# Patient Record
Sex: Female | Born: 1965 | Race: White | Hispanic: No | Smoking: Current every day smoker
Health system: Southern US, Community
[De-identification: ages and names within clinical notes are randomized; demographics above are authoritative.]

## PROBLEM LIST (undated history)

## (undated) DIAGNOSIS — I471 Supraventricular tachycardia, unspecified: Secondary | ICD-10-CM

## (undated) DIAGNOSIS — F1910 Other psychoactive substance abuse, uncomplicated: Secondary | ICD-10-CM

## (undated) DIAGNOSIS — F32A Depression, unspecified: Secondary | ICD-10-CM

## (undated) DIAGNOSIS — M8580 Other specified disorders of bone density and structure, unspecified site: Secondary | ICD-10-CM

## (undated) DIAGNOSIS — F329 Major depressive disorder, single episode, unspecified: Secondary | ICD-10-CM

## (undated) DIAGNOSIS — Z3046 Encounter for surveillance of implantable subdermal contraceptive: Secondary | ICD-10-CM

## (undated) HISTORY — DX: Encounter for surveillance of implantable subdermal contraceptive: Z30.46

## (undated) HISTORY — DX: Major depressive disorder, single episode, unspecified: F32.9

## (undated) HISTORY — DX: Other specified disorders of bone density and structure, unspecified site: M85.80

## (undated) HISTORY — DX: Other psychoactive substance abuse, uncomplicated: F19.10

## (undated) HISTORY — DX: Depression, unspecified: F32.A

---

## 1991-06-15 HISTORY — PX: EXTERNAL EAR SURGERY: SHX627

## 2007-06-15 HISTORY — PX: AUGMENTATION MAMMAPLASTY: SUR837

## 2007-11-13 HISTORY — PX: ENDOMETRIAL ABLATION: SHX621

## 2008-04-06 ENCOUNTER — Emergency Department (HOSPITAL_BASED_OUTPATIENT_CLINIC_OR_DEPARTMENT_OTHER): Admission: EM | Admit: 2008-04-06 | Discharge: 2008-04-06 | Payer: Self-pay | Admitting: Emergency Medicine

## 2008-09-04 ENCOUNTER — Ambulatory Visit: Payer: Self-pay | Admitting: Gynecology

## 2009-10-04 ENCOUNTER — Emergency Department (HOSPITAL_BASED_OUTPATIENT_CLINIC_OR_DEPARTMENT_OTHER): Admission: EM | Admit: 2009-10-04 | Discharge: 2009-10-04 | Payer: Self-pay | Admitting: Emergency Medicine

## 2009-12-25 ENCOUNTER — Ambulatory Visit: Payer: Self-pay | Admitting: Gynecology

## 2009-12-25 ENCOUNTER — Other Ambulatory Visit: Admission: RE | Admit: 2009-12-25 | Discharge: 2009-12-25 | Payer: Self-pay | Admitting: Gynecology

## 2010-01-13 ENCOUNTER — Ambulatory Visit: Payer: Self-pay | Admitting: Gynecology

## 2010-01-15 ENCOUNTER — Ambulatory Visit: Payer: Self-pay | Admitting: Gynecology

## 2010-03-17 ENCOUNTER — Ambulatory Visit: Payer: Self-pay | Admitting: Gynecology

## 2011-07-21 ENCOUNTER — Ambulatory Visit: Payer: BC Managed Care – PPO | Admitting: Family Medicine

## 2011-07-21 DIAGNOSIS — K921 Melena: Secondary | ICD-10-CM

## 2011-07-21 DIAGNOSIS — K5909 Other constipation: Secondary | ICD-10-CM

## 2011-07-21 DIAGNOSIS — K602 Anal fissure, unspecified: Secondary | ICD-10-CM

## 2011-07-21 NOTE — Progress Notes (Signed)
  Subjective:    Patient ID: Haley Carlson, female    DOB: 04/06/1966, 46 y.o.   MRN: [id number]  HPI 46 yo female with c/o rectal bleeding and constipation. Has been trying to gain weight, using protein shakes.  Has gotten very constipated, occasional liquid stool.  Constipated for several weeks - large stool that are hard to get out.  Bleeding started 3 days ago.  Only with stools, bright red.  Drops in toilet.  Painful.       Review of Systems Negative except as per HPI     Objective:   Physical Exam  Constitutional: She appears well-developed and well-nourished.  Pulmonary/Chest: Effort normal.  Genitourinary: Rectal exam shows fissure.          Assessment & Plan:  Constipation Blood in stool Anal fissure  cardizem gel, lidocaine gel Colace and miralax

## 2012-07-20 ENCOUNTER — Emergency Department (HOSPITAL_COMMUNITY)
Admission: EM | Admit: 2012-07-20 | Discharge: 2012-07-20 | Discharge status: Against Medical Advice | Payer: BC Managed Care – PPO | Attending: Emergency Medicine | Admitting: Emergency Medicine

## 2012-07-20 ENCOUNTER — Encounter (HOSPITAL_COMMUNITY): Payer: Self-pay | Admitting: *Deleted

## 2012-07-20 DIAGNOSIS — R079 Chest pain, unspecified: Secondary | ICD-10-CM | POA: Insufficient documentation

## 2012-07-20 DIAGNOSIS — F17200 Nicotine dependence, unspecified, uncomplicated: Secondary | ICD-10-CM | POA: Insufficient documentation

## 2012-07-20 LAB — BASIC METABOLIC PANEL
BUN: 20 mg/dL (ref 6–23)
CO2: 26 mEq/L (ref 19–32)
Calcium: 9.2 mg/dL (ref 8.4–10.5)
GFR calc non Af Amer: >90 mL/min (ref >90)
Glucose, Bld: 89 mg/dL (ref 70–99)
Sodium: 140 mEq/L (ref 135–145)

## 2012-07-20 LAB — CBC
HCT: 42.2 % (ref 36.0–46.0)
MCHC: 33.2 g/dL (ref 30.0–36.0)
MCV: 90.8 fL (ref 78.0–100.0)
Platelets: 225 10*3/uL (ref 150–400)
RBC: 4.65 MIL/uL (ref 3.87–5.11)

## 2012-07-20 LAB — POCT I-STAT TROPONIN I

## 2012-07-20 NOTE — ED Notes (Signed)
Pt reports "pain in her heart" 8/10 x2 weeks. Aching pain that makes her catch her breath. Slight SOB. Pt reports hx of this type of pain a few years ago. Once was dx with SVT, did not follow up with treatment. Reports nausea, denies vomiting.

## 2013-06-28 ENCOUNTER — Ambulatory Visit (INDEPENDENT_AMBULATORY_CARE_PROVIDER_SITE_OTHER): Payer: BC Managed Care – PPO | Admitting: Physician Assistant

## 2013-06-28 VITALS — BP 96/60 | HR 71 | Temp 97.9°F | Resp 16 | Ht 64.25 in | Wt 139.6 lb

## 2013-06-28 DIAGNOSIS — N951 Menopausal and female climacteric states: Secondary | ICD-10-CM

## 2013-06-28 DIAGNOSIS — F3289 Other specified depressive episodes: Secondary | ICD-10-CM

## 2013-06-28 DIAGNOSIS — F329 Major depressive disorder, single episode, unspecified: Secondary | ICD-10-CM

## 2013-06-28 DIAGNOSIS — F32A Depression, unspecified: Secondary | ICD-10-CM

## 2013-06-28 DIAGNOSIS — R232 Flushing: Secondary | ICD-10-CM

## 2013-06-28 MED ORDER — ESCITALOPRAM OXALATE 10 MG PO TABS: 10.0000 mg | ORAL_TABLET | Freq: Every day | ORAL | Status: DC

## 2013-06-28 NOTE — Patient Instructions (Addendum)
Continue attending AA meetings.  Select a therapist based on who is in your network.  Start the Lexapro by taking 1/2 tablet each day for the first week, then increase to the whole tablet.

## 2013-06-28 NOTE — Progress Notes (Signed)
   Subjective:    Patient ID: Haley Carlson, female    DOB: [birth year], 48 y.o.   MRN: [id number]  Reviewed medication, allergies, past medical history, family history and social history.   HPI  Feel sad all of the time. Never feels happy. Just doesn't feel right. Feeling began September or October of 2014.  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 Cocaine in 1980-1990 . Never used heroine or crack cocaine. Towards the end, only used alcohol and marijuana.  Has been sober for 4.5 years (May 2010). 12 steps are still working for her with regard to sobriety,  But not making her happy. Knows the steps to not drink and do drugs. Has a sponsor and a very supportive family.  Was on Lexapro 2-3 years ago but stopped taking it when she started feeling better. Has been on and off depression medications all of her life. Liked Lexapro the best and had the fewest side effects. Did not like Cymbalta made her curl up into a ball.  Doesn't "toss and turn" at night but gets up at least twice during the night.  Worries about everyday things (bills, work) and has "normal" stress.  No suicidal ideations.   Thinks she is in menopause. Has had hot flashes for at least 2 years. Getting very angry at the people she loved and very perturbed all of the time.  Is not interested in sex anymore. Isn't having any physical issues like vaginal dryness but is not interested.   Has chest pain occasional that she attributes to anxiety. Has lots of joint pain.  Is having trouble remembering little things (phone numbers she has known for years, things at work).   Review of Systems As above.     Objective:   Physical Exam  Constitutional: She is oriented to person, place, and time. She appears well-developed and well-nourished.  HENT:  Head: Normocephalic and atraumatic.  Cardiovascular: Normal rate, regular rhythm and normal heart sounds.   Pulmonary/Chest: Effort normal and breath sounds  normal.  Neurological: She is alert and oriented to person, place, and time.  Skin: Skin is warm and dry.  Psychiatric: Her speech is normal and behavior is normal. Judgment and thought content normal. Her mood appears not anxious. Cognition and memory are normal.  Appears sad. Made good eye contact.           Assessment & Plan:  1. Depression Restart Lexapro. Encourage to set up an appointment with therapist within her network and continue attending AA meetings regularly. Will re-evaluate in 6 weeks to determine if Lexapro dose is working for her or if it needs to be increased.  - escitalopram (LEXAPRO) 10 MG tablet; Take 1 tablet (10 mg total) by mouth daily.  Dispense: 90 tablet; Refill: 3  2. Hot flashes Lexapro should help to control hot flashes. If in 6 weeks she is still having hot flashes, will re-evaluate to determine alternative treatment option.

## 2013-06-29 ENCOUNTER — Encounter: Payer: Self-pay | Admitting: Physician Assistant

## 2013-06-29 DIAGNOSIS — F329 Major depressive disorder, single episode, unspecified: Secondary | ICD-10-CM | POA: Insufficient documentation

## 2013-06-29 DIAGNOSIS — F1911 Other psychoactive substance abuse, in remission: Secondary | ICD-10-CM | POA: Insufficient documentation

## 2013-06-29 DIAGNOSIS — F32A Depression, unspecified: Secondary | ICD-10-CM | POA: Insufficient documentation

## 2013-07-03 NOTE — Progress Notes (Signed)
I have examined this patient along with the student and agree.  

## 2013-07-03 NOTE — Progress Notes (Signed)
Patient scheduled appointment for 2/24 with Chelle for a CPE.

## 2013-08-07 ENCOUNTER — Encounter: Payer: BC Managed Care – PPO | Admitting: Physician Assistant

## 2013-08-09 ENCOUNTER — Ambulatory Visit: Payer: BC Managed Care – PPO | Admitting: Physician Assistant

## 2013-08-09 VITALS — BP 100/58 | HR 62 | Temp 97.8°F | Resp 16 | Ht 64.5 in | Wt 136.4 lb

## 2013-08-09 DIAGNOSIS — R059 Cough, unspecified: Secondary | ICD-10-CM

## 2013-08-09 DIAGNOSIS — R0981 Nasal congestion: Secondary | ICD-10-CM

## 2013-08-09 DIAGNOSIS — J329 Chronic sinusitis, unspecified: Secondary | ICD-10-CM

## 2013-08-09 DIAGNOSIS — R05 Cough: Secondary | ICD-10-CM

## 2013-08-09 DIAGNOSIS — J3489 Other specified disorders of nose and nasal sinuses: Secondary | ICD-10-CM

## 2013-08-09 MED ORDER — BENZONATATE 100 MG PO CAPS: 100.0000 mg | ORAL_CAPSULE | Freq: Three times a day (TID) | ORAL | Status: DC | PRN

## 2013-08-09 MED ORDER — AMOXICILLIN-POT CLAVULANATE 875-125 MG PO TABS: 1.0000 | ORAL_TABLET | Freq: Two times a day (BID) | ORAL | Status: DC

## 2013-08-09 MED ORDER — IPRATROPIUM BROMIDE 0.03 % NA SOLN: 2.0000 | Freq: Two times a day (BID) | NASAL | Status: DC

## 2013-08-09 NOTE — Progress Notes (Signed)
   Subjective:    Patient ID: Haley Carlson, female    DOB: [birth year], 48 y.o.   MRN: [id number]  HPI 48 year old female presents for evaluation of 1 week history of persistent nasal congestion, thick nasal discharge, sinus pain/pressure, slight headache, and bilateral ear fullness.  Has had slight cough that is worse at night. She has been taking Mucinex and has used a netty pot once but neither have helped much.  Admits to hx of sinus infections in the past - worse when she lived in [place].  Denies fever, chills, nausea, vomiting, dizziness, sore throat, SOB, wheezing, or chest pain     Review of Systems  Constitutional: Negative for fever and chills.  HENT: Positive for congestion, postnasal drip, rhinorrhea and sinus pressure. Negative for ear pain and sore throat.   Respiratory: Positive for cough. Negative for shortness of breath and wheezing.   Gastrointestinal: Negative for nausea, vomiting and abdominal pain.  Neurological: Positive for headaches. Negative for dizziness.       Objective:   Physical Exam  Constitutional: She is oriented to person, place, and time. She appears well-developed and well-nourished.  HENT:  Head: Normocephalic and atraumatic.  Right Ear: Hearing, tympanic membrane, external ear and ear canal normal.  Left Ear: Hearing, tympanic membrane, external ear and ear canal normal.  Nose: Right sinus exhibits maxillary sinus tenderness. Right sinus exhibits no frontal sinus tenderness. Left sinus exhibits maxillary sinus tenderness. Left sinus exhibits no frontal sinus tenderness.  Mouth/Throat: Uvula is midline, oropharynx is clear and moist and mucous membranes are normal.  Eyes: Conjunctivae are normal.  Neck: Normal range of motion. Neck supple.  Cardiovascular: Normal rate, regular rhythm and normal heart sounds.   Pulmonary/Chest: Effort normal and breath sounds normal.  Lymphadenopathy:    She has no cervical adenopathy.  Neurological: She is alert  and oriented to person, place, and time.  Psychiatric: She has a normal mood and affect. Her behavior is normal. Judgment and thought content normal.          Assessment & Plan:  Sinus infection - Plan: amoxicillin-clavulanate (AUGMENTIN) 875-125 MG per tablet  Nasal congestion - Plan: ipratropium (ATROVENT) 0.03 % nasal spray  Cough - Plan: benzonatate (TESSALON) 100 MG capsule  Will treat sinus infection with Augmentin 875 mg bid x 7 days Atrovent NS twice daily to help with congestion and PND Tessalon perles tid prn cough. Patient is in recovery and declined narcotic cough syrup Follow up if symptoms worsen or fail to improve.

## 2013-09-05 ENCOUNTER — Ambulatory Visit: Payer: BC Managed Care – PPO | Admitting: Physician Assistant

## 2013-09-05 VITALS — BP 104/64 | HR 63 | Temp 97.8°F | Resp 16 | Ht 63.0 in | Wt 137.0 lb

## 2013-09-05 DIAGNOSIS — J309 Allergic rhinitis, unspecified: Secondary | ICD-10-CM

## 2013-09-05 DIAGNOSIS — J329 Chronic sinusitis, unspecified: Secondary | ICD-10-CM

## 2013-09-05 DIAGNOSIS — R05 Cough: Secondary | ICD-10-CM

## 2013-09-05 DIAGNOSIS — R059 Cough, unspecified: Secondary | ICD-10-CM

## 2013-09-05 MED ORDER — FEXOFENADINE HCL 180 MG PO TABS: 180.0000 mg | ORAL_TABLET | Freq: Every day | ORAL | Status: DC

## 2013-09-05 MED ORDER — FLUTICASONE PROPIONATE 50 MCG/ACT NA SUSP: 2.0000 | Freq: Every day | NASAL | Status: DC

## 2013-09-05 MED ORDER — CEFDINIR 300 MG PO CAPS: 600.0000 mg | ORAL_CAPSULE | Freq: Every day | ORAL | Status: DC

## 2013-09-05 MED ORDER — ALBUTEROL SULFATE HFA 108 (90 BASE) MCG/ACT IN AERS: 2.0000 | INHALATION_SPRAY | RESPIRATORY_TRACT | Status: DC | PRN

## 2013-09-05 MED ORDER — BENZONATATE 100 MG PO CAPS: 100.0000 mg | ORAL_CAPSULE | Freq: Three times a day (TID) | ORAL | Status: DC | PRN

## 2013-09-05 NOTE — Progress Notes (Signed)
   Subjective:    Patient ID: Haley Carlson, female    DOB: 02-26-1966, 48 y.o.   MRN: [id number]  HPI   Haley Carlson is a pleasant 48 yr old female here because "I have a horrible sinus infection again."  Was here 3 weeks ago - treated for sinusitis with augmenting.  Did note some improvement but began to feel sick again 1 wk ago.  She did finish the full course of augementin as directed.  She is currently experiencing lots of nasal drainage.  She also has a productive cough.  She has some SOB and wheezing but she is a current every day smoker.  She is trying to switch to e-cigs.  Has been using Delsym for cough.  She does have some sinus pressure but no pain.  Using Delsym for cough.  Taking Allegra.   Review of Systems  Constitutional: Negative for fever and chills.  HENT: Positive for congestion, rhinorrhea and sinus pressure. Negative for ear pain.   Respiratory: Positive for cough, shortness of breath and wheezing.   Cardiovascular: Negative for chest pain.  Gastrointestinal: Negative.   Musculoskeletal: Negative.   Skin: Negative.        Objective:   Physical Exam  Vitals reviewed. Constitutional: She is oriented to person, place, and time. She appears well-developed and well-nourished. No distress.  HENT:  Head: Normocephalic and atraumatic.  Right Ear: Tympanic membrane and ear canal normal.  Left Ear: Tympanic membrane and ear canal normal.  Nose: Mucosal edema present. Right sinus exhibits maxillary sinus tenderness (minimal). Right sinus exhibits no frontal sinus tenderness. Left sinus exhibits maxillary sinus tenderness (minimal). Left sinus exhibits no frontal sinus tenderness.  Mouth/Throat: Uvula is midline, oropharynx is clear and moist and mucous membranes are normal.  Eyes: Conjunctivae are normal. No scleral icterus.  Neck: Neck supple.  Cardiovascular: Normal rate, regular rhythm and normal heart sounds.   Pulmonary/Chest: Effort normal and breath sounds normal.  She has no wheezes. She has no rales.  Lymphadenopathy:    She has no cervical adenopathy.  Neurological: She is alert and oriented to person, place, and time.  Skin: Skin is warm and dry.  Psychiatric: She has a normal mood and affect. Her behavior is normal.        Assessment & Plan:  Cough - Plan: benzonatate (TESSALON) 100 MG capsule, albuterol (PROVENTIL HFA;VENTOLIN HFA) 108 (90 BASE) MCG/ACT inhaler  Sinusitis - Plan: cefdinir (OMNICEF) 300 MG capsule, fluticasone (FLONASE) 50 MCG/ACT nasal spray, fexofenadine (ALLEGRA) 180 MG tablet  Allergic rhinitis - Plan: fluticasone (FLONASE) 50 MCG/ACT nasal spray, fexofenadine (ALLEGRA) 180 MG tablet, albuterol (PROVENTIL HFA;VENTOLIN HFA) 108 (90 BASE) MCG/ACT inhaler   Haley Carlson is a pleasant 48 yr old female here with ongoing sinusitis, incompletely resolution with augmentin 3 wks ago.  Today she has minimal sinus tenderness but copious nasal drainage as well as cough.  Will treat with omnicef.  I do think allergies are likely contributing to her symptoms.  I encouraged her to continue taking Allegra daily.  Add Flonase.  Tessalon for cough.  Will try albuterol inhaler as well to help with coughing spasms.  Encouraged her to quit smoking.  Push fluids, rest.    Pt to call or RTC if worsening or not improving  E. Frances FurbishElizabeth Gianah Batt MHS, PA-C [facility] 3/25/20156:35 PM

## 2013-09-05 NOTE — Patient Instructions (Signed)
The antibiotic prescribed today is for your present infection only. It is very important to follow the directions for the medication prescribed. Antibiotics are generally given for a specified period of time (7-10 days, for example) to be taken at specific intervals (every 4, 6, 8 or 12 hours). This is necessary to keep the right amount of the medication in the bloodstream. Too much of the medication may cause an adverse reaction, too little may not be completely effective.  To clear your infection completely, continue taking the antibiotic for the full time of treatment, even if you begin to feel better after a few days.  If you miss a dose of the antibiotic, take it as soon as possible. Then go back to your regular dosing schedule. However, don't double up doses.    Take the omnicef as directed.  Be sure to finish the full course  Take the fexofenadine (Allegra) once daily to help treat allergies  Use the fluticasone (Flonase) nasal spray once daily to help control allergy symptoms.  This works best when taken consistently every day to help prevent allergies  I think controlling your allergies better will help prevent sinus infections in the future  Use the Tessalon Perles if needed for cough  Use the albuterol inhaler if needed for coughing spasms  Plenty of fluids (water is best!) and rest  Stop smoking :)  If any symptoms are worsening or not improving, please let us know

## 2013-09-20 ENCOUNTER — Ambulatory Visit (INDEPENDENT_AMBULATORY_CARE_PROVIDER_SITE_OTHER): Payer: BC Managed Care – PPO | Admitting: Physician Assistant

## 2013-09-20 ENCOUNTER — Encounter: Payer: Self-pay | Admitting: Physician Assistant

## 2013-09-20 VITALS — BP 90/60 | HR 67 | Temp 98.0°F | Resp 16 | Ht 64.0 in | Wt 137.8 lb

## 2013-09-20 DIAGNOSIS — Z1211 Encounter for screening for malignant neoplasm of colon: Secondary | ICD-10-CM

## 2013-09-20 DIAGNOSIS — J3489 Other specified disorders of nose and nasal sinuses: Secondary | ICD-10-CM

## 2013-09-20 DIAGNOSIS — Z0000 Encounter for general adult medical examination without abnormal findings: Secondary | ICD-10-CM

## 2013-09-20 DIAGNOSIS — M2550 Pain in unspecified joint: Secondary | ICD-10-CM

## 2013-09-20 DIAGNOSIS — R6889 Other general symptoms and signs: Secondary | ICD-10-CM

## 2013-09-20 DIAGNOSIS — R0981 Nasal congestion: Secondary | ICD-10-CM

## 2013-09-20 DIAGNOSIS — R002 Palpitations: Secondary | ICD-10-CM

## 2013-09-20 DIAGNOSIS — R233 Spontaneous ecchymoses: Secondary | ICD-10-CM

## 2013-09-20 DIAGNOSIS — R059 Cough, unspecified: Secondary | ICD-10-CM

## 2013-09-20 DIAGNOSIS — R05 Cough: Secondary | ICD-10-CM

## 2013-09-20 DIAGNOSIS — R5381 Other malaise: Secondary | ICD-10-CM

## 2013-09-20 DIAGNOSIS — R238 Other skin changes: Secondary | ICD-10-CM

## 2013-09-20 DIAGNOSIS — Z1239 Encounter for other screening for malignant neoplasm of breast: Secondary | ICD-10-CM

## 2013-09-20 DIAGNOSIS — R5383 Other fatigue: Secondary | ICD-10-CM

## 2013-09-20 DIAGNOSIS — Z124 Encounter for screening for malignant neoplasm of cervix: Secondary | ICD-10-CM

## 2013-09-20 LAB — COMPREHENSIVE METABOLIC PANEL
ALK PHOS: 68 U/L (ref 39–117)
ALT: 26 U/L (ref 0–35)
AST: 26 U/L (ref 0–37)
Albumin: 4.3 g/dL (ref 3.5–5.2)
BILIRUBIN TOTAL: 0.4 mg/dL (ref 0.2–1.2)
BUN: 27 mg/dL — AB (ref 6–23)
CALCIUM: 9.3 mg/dL (ref 8.4–10.5)
CHLORIDE: 102 meq/L (ref 96–112)
CO2: 28 meq/L (ref 19–32)
Creat: 0.61 mg/dL (ref 0.50–1.10)
GLUCOSE: 76 mg/dL (ref 70–99)
Potassium: 4.5 mEq/L (ref 3.5–5.3)
SODIUM: 141 meq/L (ref 135–145)
TOTAL PROTEIN: 6.4 g/dL (ref 6.0–8.3)

## 2013-09-20 LAB — CBC WITH DIFFERENTIAL/PLATELET
BASOS ABS: 0.1 10*3/uL (ref 0.0–0.1)
BASOS PCT: 1 % (ref 0–1)
Eosinophils Absolute: 0.2 10*3/uL (ref 0.0–0.7)
Eosinophils Relative: 3 % (ref 0–5)
HCT: 38.3 % (ref 36.0–46.0)
HEMOGLOBIN: 12.9 g/dL (ref 12.0–15.0)
Lymphocytes Relative: 24 % (ref 12–46)
Lymphs Abs: 1.5 10*3/uL (ref 0.7–4.0)
MCH: 29.7 pg (ref 26.0–34.0)
MCHC: 33.7 g/dL (ref 30.0–36.0)
MCV: 88 fL (ref 78.0–100.0)
MONOS PCT: 9 % (ref 3–12)
Monocytes Absolute: 0.5 10*3/uL (ref 0.1–1.0)
NEUTROS ABS: 3.8 10*3/uL (ref 1.7–7.7)
NEUTROS PCT: 63 % (ref 43–77)
PLATELETS: 320 10*3/uL (ref 150–400)
RBC: 4.35 MIL/uL (ref 3.87–5.11)
RDW: 15 % (ref 11.5–15.5)
WBC: 6.1 10*3/uL (ref 4.0–10.5)

## 2013-09-20 LAB — POCT URINALYSIS DIPSTICK
BILIRUBIN UA: NEGATIVE
GLUCOSE UA: NEGATIVE
KETONES UA: NEGATIVE
Nitrite, UA: NEGATIVE
PROTEIN UA: NEGATIVE
RBC UA: NEGATIVE
Spec Grav, UA: >=1.03
Urobilinogen, UA: 0.2
pH, UA: 6

## 2013-09-20 LAB — LIPID PANEL
CHOLESTEROL: 222 mg/dL — AB (ref 0–200)
HDL: 82 mg/dL (ref >39)
LDL Cholesterol: 129 mg/dL — ABNORMAL HIGH (ref 0–99)
TRIGLYCERIDES: 56 mg/dL (ref <150)
Total CHOL/HDL Ratio: 2.7 Ratio
VLDL: 11 mg/dL (ref 0–40)

## 2013-09-20 LAB — POCT UA - MICROSCOPIC ONLY
CASTS, UR, LPF, POC: NEGATIVE
Crystals, Ur, HPF, POC: NEGATIVE
Mucus, UA: NEGATIVE
Yeast, UA: NEGATIVE

## 2013-09-20 LAB — IFOBT (OCCULT BLOOD): IMMUNOLOGICAL FECAL OCCULT BLOOD TEST: NEGATIVE

## 2013-09-20 MED ORDER — PREDNISONE 20 MG PO TABS: ORAL_TABLET | ORAL | Status: DC

## 2013-09-20 NOTE — Patient Instructions (Signed)
I will contact you with your lab results as soon as they are available.   If you have not heard from me in 2 weeks, please contact me.  The fastest way to get your results is to register for My Chart (see the instructions on the last page of this printout).  Keeping You Healthy  Get These Tests 1. Blood Pressure- Have your blood pressure checked once a year by your health care provider.  Normal blood pressure is 120/80. 2. Weight- Have your body mass index (BMI) calculated to screen for obesity.  BMI is measure of body fat based on height and weight.  You can also calculate your own BMI at www.nhlbisupport.com/bmi/. 3. Cholesterol- Have your cholesterol checked every 5 years starting at age 20 then yearly starting at age 45. 4. Chlamydia, HIV, and other sexually transmitted diseases- Get screened every year until age 25, then within three months of each new sexual provider. 5. Pap Smear- Every 1-3 years; discuss with your health care provider. 6. Mammogram- Every year starting at age 40  Take these medicines  Calcium with Vitamin D-Your body needs 1200 mg of Calcium each day and 800-1000 IU of Vitamin D daily.  Your body can only absorb 500 mg of Calcium at a time so Calcium must be taken in 2 or 3 divided doses throughout the day.  Multivitamin with folic acid- Once daily if it is possible for you to become pregnant.  Get these Immunizations  Gardasil-Series of three doses; prevents HPV related illness such as genital warts and cervical cancer.  Menactra-Single dose; prevents meningitis.  Tetanus shot- Every 10 years.  Flu shot-Every year.  Take these steps 1. Do not smoke-Your healthcare provider can help you quit.  For tips on how to quit go to www.smokefree.gov or call 1-800 QUITNOW. 2. Be physically active- Exercise 5 days a week for at least 30 minutes.  If you are not already physically active, start slow and gradually work up to 30 minutes of moderate physical activity.   Examples of moderate activity include walking briskly, dancing, swimming, bicycling, etc. 3. Breast Cancer- A self breast exam every month is important for early detection of breast cancer.  For more information and instruction on self breast exams, ask your healthcare provider or www.womenshealth.gov/faq/breast-self-exam.cfm. 4. Eat a healthy diet- Eat a variety of healthy foods such as fruits, vegetables, whole grains, low fat milk, low fat cheeses, yogurt, lean meats, poultry and fish, beans, nuts, tofu, etc.  For more information go to www. Thenutritionsource.org 5. Drink alcohol in moderation- Limit alcohol intake to one drink or less per day. Never drink and drive. 6. Depression- Your emotional health is as important as your physical health.  If you're feeling down or losing interest in things you normally enjoy please talk to your healthcare provider about being screened for depression. 7. Dental visit- Brush and floss your teeth twice daily; visit your dentist twice a year. 8. Eye doctor- Get an eye exam at least every 2 years. 9. Helmet use- Always wear a helmet when riding a bicycle, motorcycle, rollerblading or skateboarding. 10. Safe sex- If you may be exposed to sexually transmitted infections, use a condom. 11. Seat belts- Seat belts can save your live; always wear one. 12. Smoke/Carbon Monoxide detectors- These detectors need to be installed on the appropriate level of your home. Replace batteries at least once a year. 13. Skin cancer- When out in the sun please cover up and use sunscreen 15 SPF or higher.   14. Violence- If anyone is threatening or hurting you, please tell your healthcare provider.        

## 2013-09-20 NOTE — Progress Notes (Addendum)
Subjective:    Patient ID: Haley Carlson, female    DOB: 12/14/1965, 48 y.o.   MRN: [id number]  HPI  47y.o female with history of depression presents for annual physical exam.  She also feels as though she still has a sinus infection.  She was treated for sinusitis in February with Augmentin and again two weeks ago with Cefdenir.  She feels like her sinus pressure, sore throat and cough has improved but she is still experiencing productive cough of greenish sputum, sinus pressure and a generalized ill feeling.  Pt is also concerned about her low BP readings and feeling of dizziness the past few months.  She describes having palpitations but denies syncope, HA, chest pain, SOB, peripheral edema, or any cardiac history.  Pt also has a lesion on L anterior lower leg at mid calf level that she thinks is a wart.  Another lesion on L lateral thigh that has been there for several years, has not grown in size, but has never healed due to re-opening the lesion with shaving.   Review of Systems  Constitutional: Positive for diaphoresis and fatigue.  HENT: Positive for congestion, sinus pressure and sore throat.   Eyes: Negative.   Respiratory: Positive for cough. Negative for chest tightness, shortness of breath and wheezing.   Cardiovascular: Positive for palpitations. Negative for chest pain and leg swelling.  Gastrointestinal: Negative.   Endocrine: Positive for cold intolerance.  Genitourinary: Negative.   Musculoskeletal: Negative.   Skin: Negative for rash.  Allergic/Immunologic: Positive for environmental allergies.  Neurological: Positive for dizziness. Negative for syncope, weakness, light-headedness, numbness and headaches.  Hematological: Bruises/bleeds easily.  Psychiatric/Behavioral: Negative.        Objective:   Physical Exam  Constitutional: She is oriented to person, place, and time. She appears well-developed and well-nourished. No distress.  HENT:  Head: Normocephalic.    Eyes: Pupils are equal, round, and reactive to light.  Neck: Normal range of motion. Neck supple. No thyromegaly present.  Cardiovascular: Normal rate, regular rhythm, normal heart sounds and intact distal pulses.  Exam reveals no gallop and no friction rub.   No murmur heard. Pulmonary/Chest: Effort normal and breath sounds normal. No respiratory distress. She has no wheezes. She has no rales.  Abdominal: Soft. Bowel sounds are normal. She exhibits no distension and no mass. There is no tenderness.  Genitourinary: Vagina normal. There is no rash, tenderness or lesion on the right labia. There is no rash, tenderness or lesion on the left labia. Cervix exhibits no motion tenderness and no discharge. Right adnexum displays no mass and no tenderness. Left adnexum displays no mass and no tenderness. No tenderness or bleeding around the vagina. No vaginal discharge found.  Musculoskeletal: Normal range of motion. She exhibits no edema.  Symmetrical strength in UE, LE  Lymphadenopathy:    She has no cervical adenopathy.  Neurological: She is alert and oriented to person, place, and time. She displays normal reflexes. No cranial nerve deficit. Coordination normal.  Psychiatric: She has a normal mood and affect. Her behavior is normal.  Breast: No skin changes; No lumps, masses, or tender areas. No nipple inversion or discharge. No palpable lymph nodes. Skin: L anterior lower leg with hyperpigmented, hyperkeratotic, waxy, stuck on appearance 5mm diameter.  L posterior-lateral thigh with raised pink hyperkeratotic, scaled lesion approximately 5mm in diameter        Assessment & Plan:   1. Annual physical exam No remarkable findings on exam.  Pt with prior low  BP readings within 10 mmHg systolic.  Will await labs. - Lipid panel - POCT UA - Microscopic Only - POCT urinalysis dipstick  2. Cough 3. Nasal congestion Pt diagnosed with sinusitis and given augmentin at 2/26 office visit.  Today's  symptoms thought to be a result of remaining inflammation. - predniSONE (DELTASONE) 20 MG tablet; Take 3 PO QAM x3days, 2 PO QAM x3days, 1 PO QAM x3days  Dispense: 18 tablet; Refill: 0  4. Cold intolerance 5. Fatigue 6. Easy bruising 7. Palpitations 8. Joint pain Await lab results - CBC with Differential - Comprehensive metabolic panel - TSH   9. Screening for cervical cancer Only had cytology at 2011 office visit.  Repeat cytology and HPV as part of cervical cancer screening. - Pap IG and HPV (high risk) DNA detection  10. Screening for breast cancer Normal mammography in 2009.  Breast exam normal in office today.  Pt will schedule mammogram. - MM Digital Screening; Future  11. Screening for colon cancer Awaiting results - IFOBT POC (occult bld, rslt in office)

## 2013-09-20 NOTE — Progress Notes (Signed)
I have examined this patient along with the student and agree.  She will schedule another appointment for removal of the skin lesions on her leg.

## 2013-09-21 LAB — TSH: TSH: 1.777 u[IU]/mL (ref 0.350–4.500)

## 2013-09-24 LAB — PAP IG AND HPV HIGH-RISK: HPV DNA HIGH RISK: DETECTED — AB

## 2013-09-25 ENCOUNTER — Telehealth: Payer: Self-pay | Admitting: Family Medicine

## 2013-09-25 NOTE — Telephone Encounter (Signed)
Patient called requesting lab results

## 2013-09-26 NOTE — Telephone Encounter (Signed)
Patient notified via My Chart

## 2013-09-27 ENCOUNTER — Telehealth: Payer: Self-pay

## 2013-09-27 NOTE — Telephone Encounter (Signed)
Patient called for labs, reviewed Chelle's message with here.    Hannia,  I apologize for the delay in getting your results to you. I was waiting for your pap results, and they took longer. Thanks for reaching out.  As you can see, the blood count and kidney and liver tests are normal (the BUN is only mildly elevated, and is fine at its current level). The total cholesterol and LDL ('bad type') cholesterol are mildly elevated. The thyroid test is normal. 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  For the cholesterol, I recommend healthy eating, regular exercise, and OTC Fish Oil, 2-4 grams/day. We should recheck in 6-12 months. If it's not improving, we'll plan to discuss prescription medication to help.  Because the HPV test was positive, but the cells were all normal, we need to repeat BOTH tests in 12 months.  Please let me know if you have any questions or concerns.  Warmly,

## 2013-09-28 ENCOUNTER — Ambulatory Visit: Payer: BC Managed Care – PPO | Admitting: Internal Medicine

## 2013-09-28 VITALS — BP 124/80 | HR 52 | Temp 98.1°F | Resp 16 | Ht 64.0 in | Wt 141.0 lb

## 2013-09-28 DIAGNOSIS — S29011A Strain of muscle and tendon of front wall of thorax, initial encounter: Secondary | ICD-10-CM

## 2013-09-28 DIAGNOSIS — IMO0002 Reserved for concepts with insufficient information to code with codable children: Secondary | ICD-10-CM

## 2013-09-28 MED ORDER — CYCLOBENZAPRINE HCL 10 MG PO TABS: 10.0000 mg | ORAL_TABLET | Freq: Every day | ORAL | Status: DC

## 2013-09-28 MED ORDER — MELOXICAM 15 MG PO TABS: 15.0000 mg | ORAL_TABLET | Freq: Every day | ORAL | Status: DC

## 2013-09-28 NOTE — Patient Instructions (Signed)
Put on light duty for 2 weeks at work not to lift more than 5 lbs. Take meloxicam 15 mg 1 daily for  Pain and anti inflammatory properties Take cyclobenzaprine 10 mg 1 at bedtime to help with relief during sleep

## 2013-09-28 NOTE — Progress Notes (Signed)
Subjective:     Patient ID: Haley Carlson, female   DOB: 01-15-1966, 48 y.o.   MRN: [id number]  HPI 48 YO caucasian female [facility] right sided anterior rib/chest wall pain worse with abduction of the arm, lifting of heavy objects, coughing, deep breathing, and getting out of bed. 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 She started having this pain on Tuesday after work but has no known injury to the affected area. She has never had pain here before and is not currently taking anything for the pain. The pain is intermittent and worse with activity. She has no other symptoms but is having trouble with her physical activity at work.   Prior to Admission medications   Medication Sig Start Date End Date Taking? Authorizing Provider  albuterol (PROVENTIL HFA;VENTOLIN HFA) 108 (90 BASE) MCG/ACT inhaler Inhale 2 puffs into the lungs every 4 (four) hours as needed for wheezing or shortness of breath (cough, shortness of breath or wheezing.). 09/05/13  Yes Eleanore Delia ChimesE Egan, PA-C  aspirin 325 MG tablet Take 325 mg by mouth every 6 (six) hours as needed. Pain   Yes Historical Provider, MD  benzonatate (TESSALON) 100 MG capsule Take 1-2 capsules (100-200 mg total) by mouth 3 (three) times daily as needed for cough. 09/05/13  Yes Eleanore E Debbra RidingEgan, PA-C  escitalopram (LEXAPRO) 10 MG tablet Take 1 tablet (10 mg total) by mouth daily. 06/28/13  Yes Chelle S Jeffery, PA-C  fexofenadine (ALLEGRA) 180 MG tablet Take 1 tablet (180 mg total) by mouth daily. 09/05/13  Yes Eleanore E Egan, PA-C  fluticasone (FLONASE) 50 MCG/ACT nasal spray Place 2 sprays into both nostrils daily. 09/05/13  Yes Eleanore E Debbra RidingEgan, PA-C  ipratropium (ATROVENT) 0.03 % nasal spray Place 2 sprays into the nose 2 (two) times daily. 08/09/13  Yes Heather M Marte, PA-C  predniSONE (DELTASONE)  20 MG tablet Take 3 PO QAM x3days, 2 PO QAM x3days, 1 PO QAM x3days 09/20/13   Fernande Brashelle S Jeffery, PA-C    Review of Systems  Constitutional: Positive for activity change. Negative for fever, chills, diaphoresis, appetite change and fatigue.  HENT: Positive for rhinorrhea and sinus pressure. Negative for ear discharge, ear pain and postnasal drip.        Congestion secondary to her usual seasonal allergies  Eyes: Negative for pain and redness.  Respiratory: Positive for cough and chest tightness. Negative for wheezing.   Cardiovascular: Negative for chest pain, palpitations and leg swelling.  Gastrointestinal: Negative for nausea, vomiting, abdominal pain, diarrhea, constipation, blood in stool and abdominal distention.  Musculoskeletal: Negative for arthralgias, myalgias and neck pain.       Pain in lower anterior chest wall/ribs with movement breathing cough and getting into and out of bed  Allergic/Immunologic: Positive for environmental allergies.  Neurological: Negative for dizziness, weakness, light-headedness and headaches.       Objective:   Physical Exam  Constitutional: She is oriented to person, place, and time. She appears well-developed and well-nourished. No distress.  HENT:  Head: Normocephalic and atraumatic.  Right Ear: External ear normal.  Left Ear: External ear normal.  Mouth/Throat: Oropharynx is clear and moist. No oropharyngeal exudate.  Eyes: Conjunctivae are normal. Pupils are equal, round, and reactive to light.  Cardiovascular: Normal rate, regular rhythm, normal heart sounds and intact distal pulses.  Exam  reveals no gallop and no friction rub.   No murmur heard. Pulmonary/Chest: Effort normal and breath sounds normal. No respiratory distress. She has no wheezes. She has no rales. She exhibits tenderness.  Lower chest wall tenderness to palpation  Abdominal: Soft. Bowel sounds are normal. She exhibits no distension. There is no tenderness. There is no rebound  and no guarding.  Musculoskeletal: She exhibits tenderness. She exhibits no edema.  Tenderness to palpation of serratus anterior on the right side, 5/5 strength testing in the shoulder, no pain in the shoulder or back  Neurological: She is alert and oriented to person, place, and time.  Skin: Skin is warm and dry. No rash noted. She is not diaphoretic. No erythema.  Psychiatric: She has a normal mood and affect. Her behavior is normal.   BP 124/80  Pulse 52  Temp(Src) 98.1 F (36.7 C) (Oral)  Resp 16  Ht 5\' 4"  (1.626 m)  Wt 141 lb (63.957 kg)  BMI 24.19 kg/m2  SpO2 100%  LMP 07/18/2011     Assessment:    Muscle strain of chest wall       Plan:    Put on light duty for 2 weeks at work not to lift more than 5 lbs. Take meloxicam 15 mg 1 daily for  Pain and anti inflammatory properties Take cyclobenzaprine 10 mg 1 at bedtime to help with relief during sleep ROM/Heat    I have completed the patient encounter in its entirety as documented by [facility] Hauth, with editing by me where necessary. Tiare Rohlman P. Merla Richesoolittle, M.D.

## 2013-10-04 ENCOUNTER — Ambulatory Visit: Payer: BC Managed Care – PPO | Admitting: Physician Assistant

## 2013-10-04 ENCOUNTER — Other Ambulatory Visit: Payer: Self-pay | Admitting: Physician Assistant

## 2013-10-04 ENCOUNTER — Encounter: Payer: Self-pay | Admitting: Physician Assistant

## 2013-10-04 VITALS — BP 93/67 | HR 76 | Temp 97.3°F | Resp 16 | Ht 64.0 in | Wt 140.0 lb

## 2013-10-04 DIAGNOSIS — L989 Disorder of the skin and subcutaneous tissue, unspecified: Secondary | ICD-10-CM

## 2013-10-04 NOTE — Patient Instructions (Signed)

## 2013-10-04 NOTE — Progress Notes (Signed)
I directly supervised and participated in the procedure and agree with the student's documentation.  

## 2013-10-04 NOTE — Progress Notes (Signed)
Presents today for biopsy of 2 leg lesions  VCO.  Local anesthesia with 1.5cc of lidocaine with epinephrine.  Lesion of L tibia was prepped with betadine and draped using sterile technique.  A shave biopsy was sent to pathology.  Covered with band-aid.  VCO.  Local anesthesia with 3.5cc of lidocaine with epinephrine.  Lesion of L thigh prepped with betadine and draped using sterile technique.  Excisional ellipse biopsy performed.  Closed with #1 4-0 Ethilon VM suture and #2 4-0 Ethilon HM suture.  Covered with bandage.   Local wound care provided.  Return in about 10 days (around 10/14/2013) for suture removal, or sooner if needed.

## 2013-10-09 ENCOUNTER — Encounter: Payer: Self-pay | Admitting: Internal Medicine

## 2013-10-13 ENCOUNTER — Ambulatory Visit: Payer: BC Managed Care – PPO | Admitting: Internal Medicine

## 2013-10-13 VITALS — BP 92/70 | HR 77 | Temp 97.8°F | Resp 14 | Ht 63.25 in | Wt 144.0 lb

## 2013-10-13 DIAGNOSIS — R071 Chest pain on breathing: Secondary | ICD-10-CM

## 2013-10-13 DIAGNOSIS — S81802A Unspecified open wound, left lower leg, initial encounter: Secondary | ICD-10-CM

## 2013-10-13 DIAGNOSIS — R0789 Other chest pain: Secondary | ICD-10-CM

## 2013-10-14 NOTE — Progress Notes (Signed)
   Subjective:    Patient ID: Haley SnellenSandy Carlson, female    DOB: 01/18/1966, 48 y.o.   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 Depression 06/29/2013  . History of substance abuse 06/29/2013     Review of Systems Noncontributory No shortness of breath or chest pain with activity No palpitations    Objective:   Physical Exam BP 92/70  Pulse 77  Temp(Src) 97.8 F (36.6 C) (Oral)  Resp 14  Ht 5' 3.25" (1.607 m)  Wt 144 lb (65.318 kg)  BMI 25.29 kg/m2  SpO2 93%  LMP 07/18/2011 HEENT clear Chest clear to auscultation She is to longer tender over the rib cage as before  Suture line healed on the left lateral thigh--sutures removed  She went home and returned one hour later as the suture line popped open Exam revealed ellips 2.5 cm at widest part Tissue islands formed/no bleeding 2 Steri-Strips applied for lose control but secondary intention will be the method of healing     Assessment & Plan:  Chest wall pain  Wound of left leg  Okay to resume work

## 2014-10-01 ENCOUNTER — Other Ambulatory Visit: Payer: Self-pay | Admitting: Family Medicine

## 2014-10-01 DIAGNOSIS — M549 Dorsalgia, unspecified: Secondary | ICD-10-CM

## 2014-10-01 DIAGNOSIS — M545 Low back pain: Secondary | ICD-10-CM

## 2014-10-12 ENCOUNTER — Ambulatory Visit
Admission: RE | Admit: 2014-10-12 | Discharge: 2014-10-12 | Disposition: A | Discharge status: Home / Self Care | Payer: Worker's Compensation | Source: Ambulatory Visit | Attending: Family Medicine | Admitting: Family Medicine

## 2014-10-12 DIAGNOSIS — M545 Low back pain: Secondary | ICD-10-CM

## 2014-10-12 DIAGNOSIS — M549 Dorsalgia, unspecified: Secondary | ICD-10-CM

## 2014-12-06 ENCOUNTER — Encounter (HOSPITAL_COMMUNITY): Payer: Self-pay | Admitting: Emergency Medicine

## 2014-12-06 ENCOUNTER — Emergency Department (HOSPITAL_COMMUNITY)
Admission: EM | Admit: 2014-12-06 | Discharge: 2014-12-06 | Disposition: A | Discharge status: Home / Self Care | Payer: Worker's Compensation | Attending: Emergency Medicine | Admitting: Emergency Medicine

## 2014-12-06 ENCOUNTER — Emergency Department (HOSPITAL_COMMUNITY): Payer: Worker's Compensation

## 2014-12-06 DIAGNOSIS — Y999 Unspecified external cause status: Secondary | ICD-10-CM | POA: Insufficient documentation

## 2014-12-06 DIAGNOSIS — Z720 Tobacco use: Secondary | ICD-10-CM | POA: Diagnosis not present

## 2014-12-06 DIAGNOSIS — S92421B Displaced fracture of distal phalanx of right great toe, initial encounter for open fracture: Secondary | ICD-10-CM | POA: Diagnosis not present

## 2014-12-06 DIAGNOSIS — F329 Major depressive disorder, single episode, unspecified: Secondary | ICD-10-CM | POA: Insufficient documentation

## 2014-12-06 DIAGNOSIS — Y929 Unspecified place or not applicable: Secondary | ICD-10-CM | POA: Diagnosis not present

## 2014-12-06 DIAGNOSIS — G8929 Other chronic pain: Secondary | ICD-10-CM | POA: Insufficient documentation

## 2014-12-06 DIAGNOSIS — S92911B Unspecified fracture of right toe(s), initial encounter for open fracture: Secondary | ICD-10-CM

## 2014-12-06 DIAGNOSIS — S99921A Unspecified injury of right foot, initial encounter: Secondary | ICD-10-CM | POA: Diagnosis present

## 2014-12-06 DIAGNOSIS — W230XXA Caught, crushed, jammed, or pinched between moving objects, initial encounter: Secondary | ICD-10-CM | POA: Insufficient documentation

## 2014-12-06 DIAGNOSIS — Z79899 Other long term (current) drug therapy: Secondary | ICD-10-CM | POA: Insufficient documentation

## 2014-12-06 DIAGNOSIS — S92531B Displaced fracture of distal phalanx of right lesser toe(s), initial encounter for open fracture: Secondary | ICD-10-CM | POA: Diagnosis not present

## 2014-12-06 DIAGNOSIS — S9781XA Crushing injury of right foot, initial encounter: Secondary | ICD-10-CM | POA: Diagnosis not present

## 2014-12-06 DIAGNOSIS — Y939 Activity, unspecified: Secondary | ICD-10-CM | POA: Insufficient documentation

## 2014-12-06 MED ORDER — CEPHALEXIN 500 MG PO CAPS: 500.0000 mg | ORAL_CAPSULE | Freq: Two times a day (BID) | ORAL | Status: DC

## 2014-12-06 MED ORDER — OXYCODONE-ACETAMINOPHEN 5-325 MG PO TABS
2.0000 | ORAL_TABLET | Freq: Once | ORAL | Status: AC
Start: 2014-12-06 — End: 2014-12-06
  Administered 2014-12-06: 2 via ORAL
  Filled 2014-12-06: qty 2

## 2014-12-06 MED ORDER — HYDROCODONE-ACETAMINOPHEN 5-325 MG PO TABS: 1.0000 | ORAL_TABLET | Freq: Four times a day (QID) | ORAL | Status: DC | PRN

## 2014-12-06 MED ORDER — IBUPROFEN 800 MG PO TABS: 800.0000 mg | ORAL_TABLET | Freq: Three times a day (TID) | ORAL | Status: DC

## 2014-12-06 MED ORDER — ONDANSETRON HCL 4 MG/2ML IJ SOLN
4.0000 mg | INTRAMUSCULAR | Status: AC
  Administered 2014-12-06: 4 mg via INTRAVENOUS
  Filled 2014-12-06: qty 2

## 2014-12-06 MED ORDER — MORPHINE SULFATE 4 MG/ML IJ SOLN
2.0000 mg | Freq: Once | INTRAMUSCULAR | Status: AC
Start: 2014-12-06 — End: 2014-12-06
  Administered 2014-12-06: 2 mg via INTRAVENOUS
  Filled 2014-12-06: qty 1

## 2014-12-06 MED ORDER — MORPHINE SULFATE 4 MG/ML IJ SOLN
2.0000 mg | Freq: Once | INTRAMUSCULAR | Status: AC
  Administered 2014-12-06: 2 mg via INTRAVENOUS
  Filled 2014-12-06: qty 1

## 2014-12-06 MED ORDER — CEFAZOLIN SODIUM 1-5 GM-% IV SOLN
1.0000 g | Freq: Once | INTRAVENOUS | Status: AC
  Administered 2014-12-06: 1 g via INTRAVENOUS
  Filled 2014-12-06: qty 50

## 2014-12-06 NOTE — ED Notes (Addendum)
Per EMS pt presents with right foot injury post forklift running over right foot. Pedal pulse present; bleeding controlled. Pt given 100 mcg of fentanyl en route pain decreased to 6/10.

## 2014-12-06 NOTE — ED Provider Notes (Signed)
CSN: [id number]     Arrival date & time 12/06/14  1642 History   First MD Initiated Contact with Patient 12/06/14 1648     Chief Complaint  Patient presents with  . Foot Injury     (Consider location/radiation/quality/duration/timing/severity/associated sxs/prior Treatment) HPI   Pt is a 49 year old female with history of ETOH and narcotic addiction, depression, reported 6 years of sobriety, chronic back pain, who presents to [facility] ED tonight after a forklift crushed her right toes and foot at approximately 3:45pm today.  The forklift rolled up her foot about half way, she screamed and the driver immediately backed up.  She describes the pain as a band around her toes with some associated numbness of toes and top of foot.  It is swollen with bruising and blood coming great toe and second toe.  All toes are pink and her ankle has no pain or swelling.  She has not had any chest pain, SOB, anxiety, fever, chills, N/V since the time of the incident.  Past Medical History  Diagnosis Date  . Implanon removal     PT HAD IMPLANON INSERTED 12/2009 AND REMOVED 03/17/10  . Depression   . Substance abuse     cocaine, marijuana, alcohol; sober since 10/2008   Past Surgical History  Procedure Laterality Date  . Endometrial ablation  11/2007    HER OPTION ABLATION  . External ear surgery  1993    CYST REMOVED  . Augmentation mammaplasty      IMPLANTS   Family History  Problem Relation Age of Onset  . Hypertension Mother   . Cancer Mother     breast cancer 2014  . Thyroid disease Mother     Hashimoto's  . Dementia Father    History  Substance Use Topics  . Smoking status: Current Every Day Smoker -- 0.50 packs/day for 25 years    Types: Cigarettes  . Smokeless tobacco: Not on file  . Alcohol Use: No     Comment: recovering alcoholic    OB History    No data available     Review of Systems  All other systems reviewed and are negative.   Allergies  Cymbalta  Home Medications    Prior to Admission medications   Medication Sig Start Date End Date Taking? Authorizing Provider  Acetaminophen-Caff-Pyrilamine (MIDOL COMPLETE PO) Take 2 tablets by mouth every 6 (six) hours as needed (cramps, pain).   Yes Historical Provider, MD  aspirin 325 MG tablet Take 325 mg by mouth every 6 (six) hours as needed for mild pain or moderate pain.    Yes Historical Provider, MD  cyclobenzaprine (FLEXERIL) 10 MG tablet Take 1 tablet (10 mg total) by mouth at bedtime. 09/28/13  Yes Tonye Pearson, MD  fluticasone (FLONASE) 50 MCG/ACT nasal spray Place 2 sprays into both nostrils daily. Patient taking differently: Place 2 sprays into both nostrils daily as needed for allergies.  09/05/13  Yes Eleanore Delia Chimes, PA-C  meloxicam (MOBIC) 15 MG tablet Take 1 tablet (15 mg total) by mouth daily. Patient taking differently: Take 15 mg by mouth daily as needed for pain.  09/28/13  Yes Tonye Pearson, MD  albuterol (PROVENTIL HFA;VENTOLIN HFA) 108 (90 BASE) MCG/ACT inhaler Inhale 2 puffs into the lungs every 4 (four) hours as needed for wheezing or shortness of breath (cough, shortness of breath or wheezing.). Patient not taking: Reported on 12/06/2014 09/05/13   Godfrey Pick, PA-C  benzonatate (TESSALON) 100 MG capsule Take  1-2 capsules (100-200 mg total) by mouth 3 (three) times daily as needed for cough. Patient not taking: Reported on 12/06/2014 09/05/13   Godfrey Pick, PA-C  cephALEXin (KEFLEX) 500 MG capsule Take 1 capsule (500 mg total) by mouth 2 (two) times daily. 12/06/14   Danelle Berry, PA-C  escitalopram (LEXAPRO) 10 MG tablet Take 1 tablet (10 mg total) by mouth daily. Patient not taking: Reported on 12/06/2014 06/28/13   Porfirio Oar, PA-C  fexofenadine (ALLEGRA) 180 MG tablet Take 1 tablet (180 mg total) by mouth daily. Patient not taking: Reported on 12/06/2014 09/05/13   Godfrey Pick, PA-C  HYDROcodone-acetaminophen (NORCO/VICODIN) 5-325 MG per tablet Take 1-2 tablets by mouth  every 6 (six) hours as needed for severe pain. 12/06/14   Danelle Berry, PA-C  ibuprofen (ADVIL,MOTRIN) 800 MG tablet Take 1 tablet (800 mg total) by mouth 3 (three) times daily. 12/06/14   Danelle Berry, PA-C  ipratropium (ATROVENT) 0.03 % nasal spray Place 2 sprays into the nose 2 (two) times daily. Patient not taking: Reported on 12/06/2014 08/09/13   Nelva Nay, PA-C  predniSONE (DELTASONE) 20 MG tablet Take 3 PO QAM x3days, 2 PO QAM x3days, 1 PO QAM x3days Patient not taking: Reported on 12/06/2014 09/20/13   Chelle Jeffery, PA-C   BP 103/65 mmHg  Pulse 68  Temp(Src) 98.3 F (36.8 C) (Oral)  Resp 16  SpO2 95%  LMP 07/18/2011 Physical Exam  Constitutional: She is oriented to person, place, and time. She appears well-developed and well-nourished. No distress.  HENT:  Head: Normocephalic and atraumatic.  Nose: Nose normal.  Mouth/Throat: Oropharynx is clear and moist. No oropharyngeal exudate.  Eyes: Conjunctivae and EOM are normal. Pupils are equal, round, and reactive to light. Right eye exhibits no discharge. Left eye exhibits no discharge. No scleral icterus.  Neck: Normal range of motion. No JVD present. No tracheal deviation present. No thyromegaly present.  Cardiovascular: Normal rate, regular rhythm, normal heart sounds and intact distal pulses.  Exam reveals no gallop and no friction rub.   No murmur heard. Pulmonary/Chest: Effort normal and breath sounds normal. No respiratory distress. She has no wheezes. She has no rales. She exhibits no tenderness.  Abdominal: Soft. Bowel sounds are normal. She exhibits no distension.  Musculoskeletal:       Right ankle: Normal.  Bruise and swelling half way up the dorsal surface of foot. Great toe, hematoma and bleeding at the proximal nail bed, medial aspect of toe has small amt of broken skin, not actively bleeding.  Grossly swollen with bruising visible on the plantar aspect, lateral side of great toe. 2nd toe - nail avulsion at the  proximal nail bed  Neurological: She is alert and oriented to person, place, and time. She has normal strength and normal reflexes. She displays no tremor.  Skin: Skin is warm and dry. She is not diaphoretic. No pallor.  Psychiatric: She has a normal mood and affect. Her behavior is normal. Judgment and thought content normal.  Nursing note and vitals reviewed.   ED Course  Procedures (including critical care time) Labs Review Labs Reviewed - No data to display  Imaging Review Dg Foot Complete Right  12/06/2014   CLINICAL DATA:  Right foot injury.  Forklift ran over foot.  EXAM: RIGHT FOOT COMPLETE - 3+ VIEW  COMPARISON:  None.  FINDINGS: There are fracture deformities involving the distal aspects of the first and second distal phalanges. Gas is noted within the soft tissues around the second  distal phalanx. There are no additional fractures or subluxations identified. Marked dorsal soft tissue swelling noted.  IMPRESSION: 1. Acute fracture deformities involve the distal aspect of the first and second distal phalanges. 2. Dorsal soft tissue swelling.   Electronically Signed   By: Signa Kell M.D.   On: 12/06/2014 17:59     EKG Interpretation None      MDM   Final diagnoses:  Toe fracture, right, open, initial encounter  Crush injury of right foot, initial encounter    Patient with crush injury to her right foot, by a forklift which rolled up her foot, about halfway.  Patient feels minimal pain at this time, first and second toe are bloody but bleeding is controlled at this time, swelling and bruising on the dorsal aspect of the foot.  Dorsal pedis and posterior tibialis pulses palpated, 2+.  X-ray shows acute fracture deformities of the distal aspect of the first and second phalanx, with dorsal soft tissue swelling.  Patient will be given pain medicine, but will be irrigated with sterile saline and nail-polish removed for better evaluation - want to r/o open fracture.  Pt has  sensation to touch in all toes and foot.  Ankle normal ROM, able to flex and extend all toes, with increased pain with 1st and 2nd toe.   After irrigating foot with sterile saline, there is broken skin on the medial side of great toe, with hematoma at the base of the nailbed, not actively bleeding.  The 2nd toe has partially avulsed nail at the proximal nail fold.  Pt pain has increased to 7/10 stabbing feeling in the toes, and even greater throbbing while in the dependent position.  Will elevate and give an additional 2mL of morphine.    9:08 PM  Dr. Gwendolyn Grant has consulted Dr. Magnus Ivan.  Will give IV ancef, dress wound with xeroform in between toes, f/u with Magnus Ivan in office, arrange by calling office on Monday, Keflex  BID, pain meds.  Ordered post-op shoe, crutches, discussed plan with pt and gave return precautions.  Pt and husband at bedside verbalized understanding.     Danelle Berry, PA-C 12/08/14 1039  Elwin Mocha, MD 12/08/14 2329

## 2014-12-06 NOTE — ED Notes (Signed)
Bed: WTR9 Expected date:  Expected time:  Means of arrival:  Comments: EMS/7F/crush injury to foot

## 2014-12-06 NOTE — Discharge Instructions (Signed)
Crush Injury, Fingers or Toes A crush injury to the fingers or toes means the tissues have been damaged by being squeezed (compressed). There will be bleeding into the tissues and swelling. Often, blood will collect under the skin. When this happens, the skin on the finger often dies and may slough off (shed) 1 week to 10 days later. Usually, new skin is growing underneath. If the injury has been too severe and the tissue does not survive, the damaged tissue may begin to turn black over several days.  Wounds which occur because of the crushing may be stitched (sutured) shut. However, crush injuries are more likely to become infected than other injuries.These wounds may not be closed as tightly as other types of cuts to prevent infection. Nails involved are often lost. These usually grow back over several weeks.  DIAGNOSIS X-rays may be taken to see if there is any injury to the bones. TREATMENT Broken bones (fractures) may be treated with splinting, depending on the fracture. Often, no treatment is required for fractures of the last bone in the fingers or toes. HOME CARE INSTRUCTIONS   The crushed part should be raised (elevated) above the heart or center of the chest as much as possible for the first several days or as directed. This helps with pain and lessens swelling. Less swelling increases the chances that the crushed part will survive.  Put ice on the injured area.  Put ice in a plastic bag.  Place a towel between your skin and the bag.  Leave the ice on for 15-20 minutes, 03-04 times a day for the first 2 days.  Only take over-the-counter or prescription medicines for pain, discomfort, or fever as directed by your caregiver.  Use your injured part only as directed.  Change your bandages (dressings) as directed.  Keep all follow-up appointments as directed by your caregiver. Not keeping your appointment could result in a chronic or permanent injury, pain, and disability. If there is  any problem keeping the appointment, you must call to reschedule. SEEK IMMEDIATE MEDICAL CARE IF:   There is redness, swelling, or increasing pain in the wound area.  Pus is coming from the wound.  You have a fever.  You notice a bad smell coming from the wound or dressing.  The edges of the wound do not stay together after the sutures have been removed.  You are unable to move the injured finger or toe. MAKE SURE YOU:   Understand these instructions.  Will watch your condition.  Will get help right away if you are not doing well or g Toe Fracture Your caregiver has diagnosed you as having a fractured toe. A toe fracture is a break in the bone of a toe. "Buddy taping" is a way of splinting your broken toe, by taping the broken toe to the toe next to it. This "buddy taping" will keep the injured toe from moving beyond normal range of motion. Buddy taping also helps the toe heal in a more normal alignment. It may take 6 to 8 weeks for the toe injury to heal. HOME CARE INSTRUCTIONS  Leave your toes taped together for as long as directed by your caregiver or until you see a doctor for a follow-up examination. You can change the tape after bathing. Always use a small piece of gauze or cotton between the toes when taping them together. This will help the skin stay dry and prevent infection. Apply ice to the injury for 15-20 minutes each hour  while awake for the first 2 days. Put the ice in a plastic bag and place a towel between the bag of ice and your skin. After the first 2 days, apply heat to the injured area. Use heat for the next 2 to 3 days. Place a heating pad on the foot or soak the foot in warm water as directed by your caregiver. Keep your foot elevated as much as possible to lessen swelling. Wear sturdy, supportive shoes. The shoes should not pinch the toes or fit tightly against the toes. Your caregiver may prescribe a rigid shoe if your foot is very swollen. Your may be given  crutches if the pain is too great and it hurts too much to walk. Only take over-the-counter or prescription medicines for pain, discomfort, or fever as directed by your caregiver. If your caregiver has given you a follow-up appointment, it is very important to keep that appointment. Not keeping the appointment could result in a chronic or permanent injury, pain, and disability. If there is any problem keeping the appointment, you must call back to this facility for assistance. SEEK MEDICAL CARE IF:  You have increased pain or swelling, not relieved with medications. The pain does not get better after 1 week. Your injured toe is cold when the others are warm. SEEK IMMEDIATE MEDICAL CARE IF:  The toe becomes cold, numb, or white. The toe becomes hot (inflamed) and red. Document Released: 05/28/2000 Document Revised: 08/23/2011 Document Reviewed: 01/15/2008 [facility] Patient Information 2015 [place], [place]. This information is not intended to replace advice given to you by your health care provider. Make sure you discuss any questions you have with your health care provider.  et worse. Document Released: 05/31/2005 Document Revised: 08/23/2011 Document Reviewed: 10/16/2010 [facility] Patient Information 2015 [place], [place]. This information is not intended to replace advice given to you by your health care provider. Make sure you discuss any questions you have with your health care provider.

## 2014-12-08 ENCOUNTER — Telehealth: Payer: Self-pay | Admitting: Physician Assistant

## 2014-12-08 NOTE — Telephone Encounter (Signed)
Erroneous encounter

## 2015-01-29 ENCOUNTER — Ambulatory Visit (INDEPENDENT_AMBULATORY_CARE_PROVIDER_SITE_OTHER): Payer: BLUE CROSS/BLUE SHIELD | Admitting: Family Medicine

## 2015-01-29 VITALS — BP 104/70 | HR 68 | Temp 97.7°F | Resp 16 | Ht 63.25 in | Wt 175.0 lb

## 2015-01-29 DIAGNOSIS — F330 Major depressive disorder, recurrent, mild: Secondary | ICD-10-CM | POA: Diagnosis not present

## 2015-01-29 DIAGNOSIS — L729 Follicular cyst of the skin and subcutaneous tissue, unspecified: Secondary | ICD-10-CM

## 2015-01-29 DIAGNOSIS — N951 Menopausal and female climacteric states: Secondary | ICD-10-CM

## 2015-01-29 MED ORDER — ESCITALOPRAM OXALATE 10 MG PO TABS: 10.0000 mg | ORAL_TABLET | Freq: Every day | ORAL | Status: DC

## 2015-01-29 MED ORDER — ESCITALOPRAM OXALATE 20 MG PO TABS: ORAL_TABLET | ORAL | Status: DC

## 2015-01-29 NOTE — Progress Notes (Signed)
[facility] [address], [place] [address] [phone]  Date:  01/29/2015   Name:  Haley Carlson   DOB:  May 28, 1966   MRN:  [id number]  PCP:  No primary care provider on file.    Chief Complaint: Medication Refill; Hot flashes; and Possible cyst   History of Present Illness:  Haley Carlson is a 49 y.o. very pleasant female patient who presents with the following:  She had been on lexapro 10- she thinks that she would like to go up to 20 mg anyway.  She felt better while she was on it- since she stopped it she has noted more sadness and depression sx, wants to go back on it. She has been on lexapro for a year or so, but has used other SSRI in the past.   However lexapro seemed to work the best She is been menopausal for 4 years or so.  They seem to be worse lately; she notes that since she came off the lexapro her vasomotor sx are worse. She is bothered by hot flashes esp at night - they will wake her from sleep   She also has a cyst on her left perigenital area- this seemed to start with a hair bump about 4 months ago but then continued to be present.  It is not really tender unless she directly squeezes it. There is no drainage, no heat, no redness  Patient Active Problem List   Diagnosis Date Noted  . Depression 06/29/2013  . History of substance abuse 06/29/2013    Past Medical History  Diagnosis Date  . Implanon removal     PT HAD IMPLANON INSERTED 12/2009 AND REMOVED 03/17/10  . Depression   . Substance abuse     cocaine, marijuana, alcohol; sober since 10/2008    Past Surgical History  Procedure Laterality Date  . Endometrial ablation  11/2007    HER OPTION ABLATION  . External ear surgery  1993    CYST REMOVED  . Augmentation mammaplasty      IMPLANTS    Social History  Substance Use Topics  . Smoking status: Current Every Day Smoker -- 0.50 packs/day for 25 years    Types: Cigarettes  . Smokeless tobacco: None     Comment: Vapor   . Alcohol Use: No     Comment: recovering alcoholic     Family History  Problem Relation Age of Onset  . Hypertension Mother   . Cancer Mother     breast cancer 2014  . Thyroid disease Mother     Hashimoto's  . Dementia Father     Allergies  Allergen Reactions  . Cymbalta [Duloxetine Hcl] Other (See Comments)    Pt states it makes her "feel crazy"     Medication list has been reviewed and updated.  Current Outpatient Prescriptions on File Prior to Visit  Medication Sig Dispense Refill  . Acetaminophen-Caff-Pyrilamine (MIDOL COMPLETE PO) Take 2 tablets by mouth every 6 (six) hours as needed (cramps, pain).    Marland Kitchen escitalopram (LEXAPRO) 10 MG tablet Take 1 tablet (10 mg total) by mouth daily. 90 tablet 3  . fexofenadine (ALLEGRA) 180 MG tablet Take 1 tablet (180 mg total) by mouth daily. 90 tablet 2  . albuterol (PROVENTIL HFA;VENTOLIN HFA) 108 (90 BASE) MCG/ACT inhaler Inhale 2 puffs into the lungs every 4 (four) hours as needed for wheezing or shortness of breath (cough, shortness of breath or wheezing.). (Patient not taking: Reported on 12/06/2014) 1  Inhaler 1  . aspirin 325 MG tablet Take 325 mg by mouth every 6 (six) hours as needed for mild pain or moderate pain.     . cyclobenzaprine (FLEXERIL) 10 MG tablet Take 1 tablet (10 mg total) by mouth at bedtime. (Patient not taking: Reported on 01/29/2015) 15 tablet 0  . HYDROcodone-acetaminophen (NORCO/VICODIN) 5-325 MG per tablet Take 1-2 tablets by mouth every 6 (six) hours as needed for severe pain. (Patient not taking: Reported on 01/29/2015) 20 tablet 0  . ibuprofen (ADVIL,MOTRIN) 800 MG tablet Take 1 tablet (800 mg total) by mouth 3 (three) times daily. (Patient not taking: Reported on 01/29/2015) 21 tablet 0   No current facility-administered medications on file prior to visit.    Review of Systems:  As per HPI- otherwise negative.   Physical Examination: Filed Vitals:   01/29/15 1141  BP: 104/70  Pulse: 68  Temp: 97.7  F (36.5 C)  Resp: 16   Filed Vitals:   01/29/15 1141  Height: 5' 3.25" (1.607 m)  Weight: 175 lb (79.379 kg)   Body mass index is 30.74 kg/(m^2). Ideal Body Weight: Weight in (lb) to have BMI = 25: 142  GEN: WDWN, NAD, Non-toxic, A & O x 3, overweight, looks well HEENT: Atraumatic, Normocephalic. Neck supple. No masses, No LAD. Ears and Nose: No external deformity. CV: RRR, No M/G/R. No JVD. No thrill. No extra heart sounds. PULM: CTA B, no wheezes, crackles, rhonchi. No retractions. No resp. distress. No accessory muscle use. ABD: S, NT, ND On the left inner thigh just lateral to the labia majora there is a subque cyst.  It is about the size of a pea, is mobile and non- tender.  Does not seem to be fluctuant EXTR: No c/c/e NEURO Normal gait.  PSYCH: Normally interactive. Conversant. Not depressed or anxious appearing.  Calm demeanor.    Assessment and Plan: Major depressive disorder, recurrent episode, mild - Plan: escitalopram (LEXAPRO) 20 MG tablet, DISCONTINUED: escitalopram (LEXAPRO) 10 MG tablet, DISCONTINUED: escitalopram (LEXAPRO) 10 MG tablet  Menopausal hot flushes - Plan: escitalopram (LEXAPRO) 20 MG tablet, DISCONTINUED: escitalopram (LEXAPRO) 10 MG tablet  Cyst of buttocks  Start back on lexapro - will go to 20 mg, start with 10 mg for a week. Discussed HRT- she does not wish to take this risk, will try lexapro which may help with vasomotor sx as well.   The cyst on her buttock is benign- she could have it removed if she likes but this is not necessary unless she wants to pursue this.  For the time being she wishes to just observe  Signed Abbe Amsterdam, MD

## 2015-01-29 NOTE — Patient Instructions (Signed)
Start back on lexapro- take a 1/2 tablet for one week, then go up to a whole tablet (20 mg) I think that the area on your groin is a cyst- if this gets bigger or becomes painful please come back in I hope that the lexapro will help with your hot flashes- let me know if you decide you want to pursue hormone replacement  Please follow-up with [place] in 6 months or so- sooner if medications are not helping you

## 2015-07-27 ENCOUNTER — Ambulatory Visit (INDEPENDENT_AMBULATORY_CARE_PROVIDER_SITE_OTHER): Payer: BLUE CROSS/BLUE SHIELD | Admitting: Physician Assistant

## 2015-07-27 VITALS — BP 110/78 | HR 87 | Temp 98.2°F | Resp 18 | Ht 65.0 in | Wt 176.8 lb

## 2015-07-27 DIAGNOSIS — Q520 Congenital absence of vagina: Secondary | ICD-10-CM

## 2015-07-27 MED ORDER — DOXYCYCLINE HYCLATE 100 MG PO TABS: 100.0000 mg | ORAL_TABLET | Freq: Two times a day (BID) | ORAL | Status: DC

## 2015-07-27 NOTE — Patient Instructions (Signed)
Warm compresses Take all of the antibiotics

## 2015-07-27 NOTE — Progress Notes (Signed)
   Haley Carlson  MRN: [id number] DOB: [birth year]  Subjective:  Pt presents to clinic  Patient Active Problem List   Diagnosis Date Noted  . Depression 06/29/2013  . History of substance abuse 06/29/2013    Current Outpatient Prescriptions on File Prior to Visit  Medication Sig Dispense Refill  . escitalopram (LEXAPRO) 20 MG tablet Take 1 daily- take a 1/2 tablet for the first week of therapy 90 tablet 3  . traMADol (ULTRAM) 50 MG tablet Take by mouth every 12 (twelve) hours as needed.    . Acetaminophen-Caff-Pyrilamine (MIDOL COMPLETE PO) Take 2 tablets by mouth every 6 (six) hours as needed (cramps, pain). Reported on 07/27/2015    . albuterol (PROVENTIL HFA;VENTOLIN HFA) 108 (90 BASE) MCG/ACT inhaler Inhale 2 puffs into the lungs every 4 (four) hours as needed for wheezing or shortness of breath (cough, shortness of breath or wheezing.). (Patient not taking: Reported on 12/06/2014) 1 Inhaler 1  . aspirin 325 MG tablet Take 325 mg by mouth every 6 (six) hours as needed for mild pain or moderate pain. Reported on 07/27/2015    . cyclobenzaprine (FLEXERIL) 10 MG tablet Take 1 tablet (10 mg total) by mouth at bedtime. (Patient not taking: Reported on 01/29/2015) 15 tablet 0  . fexofenadine (ALLEGRA) 180 MG tablet Take 1 tablet (180 mg total) by mouth daily. (Patient not taking: Reported on 07/27/2015) 90 tablet 2   No current facility-administered medications on file prior to visit.    Allergies  Allergen Reactions  . Cymbalta [Duloxetine Hcl] Other (See Comments)    Pt states it makes her "feel crazy"     Review of Systems Objective:  BP 110/78 mmHg  Pulse 87  Temp(Src) 98.2 F (36.8 C) (Oral)  Resp 18  Ht  (1.651 m)  Wt 176 lb 12.8 oz (80.196 kg)  BMI 29.42 kg/m2  SpO2 97%  LMP 07/18/2011  Physical Exam  Assessment and Plan :  No diagnosis found.  Benny Lennert PA-C  [facility] 07/27/2015 8:25 AM

## 2015-07-27 NOTE — Progress Notes (Signed)
   Haley Carlson  MRN: [id number] DOB: [birth year]  Subjective:  Pt presents to clinic with an area on her left labia that has been swollen and painful for the last several days.  She tried to open it with a pin but only blood came out.  She feels like it is slightly better today.  She has had no fevers or chills.  Patient Active Problem List   Diagnosis Date Noted  . Depression 06/29/2013  . History of substance abuse 06/29/2013    Current Outpatient Prescriptions on File Prior to Visit  Medication Sig Dispense Refill  . escitalopram (LEXAPRO) 20 MG tablet Take 1 daily- take a 1/2 tablet for the first week of therapy 90 tablet 3  . traMADol (ULTRAM) 50 MG tablet Take by mouth every 12 (twelve) hours as needed.    Marland Kitchen albuterol (PROVENTIL HFA;VENTOLIN HFA) 108 (90 BASE) MCG/ACT inhaler Inhale 2 puffs into the lungs every 4 (four) hours as needed for wheezing or shortness of breath (cough, shortness of breath or wheezing.). (Patient not taking: Reported on 12/06/2014) 1 Inhaler 1  . aspirin 325 MG tablet Take 325 mg by mouth every 6 (six) hours as needed for mild pain or moderate pain. Reported on 07/27/2015     No current facility-administered medications on file prior to visit.    Allergies  Allergen Reactions  . Cymbalta [Duloxetine Hcl] Other (See Comments)    Pt states it makes her "feel crazy"     Review of Systems  Constitutional: Negative for fever and chills.  Skin: Positive for wound.   Objective:  BP 110/78 mmHg  Pulse 87  Temp(Src) 98.2 F (36.8 C) (Oral)  Resp 18  Ht  (1.651 m)  Wt 176 lb 12.8 oz (80.196 kg)  BMI 29.42 kg/m2  SpO2 97%  LMP 07/18/2011  Physical Exam  Constitutional: She is oriented to person, place, and time and well-developed, well-nourished, and in no distress.  HENT:  Head: Normocephalic and atraumatic.  Right Ear: Hearing and external ear normal.  Left Ear: Hearing and external ear normal.  Eyes: Conjunctivae are normal.  Neck:  Normal range of motion.  Pulmonary/Chest: Effort normal.  Neurological: She is alert and oriented to person, place, and time. Gait normal.  Skin: Skin is warm and dry.  Left labia upper portion with a 1 cm abscess with a scabbed over area that is TTP  Psychiatric: Mood, memory, affect and judgment normal.  Vitals reviewed.   Assessment and Plan :  Vaginal absence - Plan: doxycycline (VIBRA-TABS) 100 MG tablet, Care order/instruction   Symptomatic care d/w pt to do along with the abx.  Benny Lennert PA-C  [facility] 07/27/2015 10:59 AM

## 2015-12-13 ENCOUNTER — Ambulatory Visit (INDEPENDENT_AMBULATORY_CARE_PROVIDER_SITE_OTHER): Payer: BLUE CROSS/BLUE SHIELD | Admitting: Osteopathic Medicine

## 2015-12-13 ENCOUNTER — Other Ambulatory Visit: Payer: Self-pay

## 2015-12-13 VITALS — BP 104/70 | HR 75 | Temp 98.1°F | Resp 16 | Ht 64.5 in | Wt 177.4 lb

## 2015-12-13 DIAGNOSIS — M546 Pain in thoracic spine: Secondary | ICD-10-CM | POA: Diagnosis not present

## 2015-12-13 MED ORDER — CYCLOBENZAPRINE HCL 5 MG PO TABS: 5.0000 mg | ORAL_TABLET | Freq: Three times a day (TID) | ORAL | Status: DC | PRN

## 2015-12-13 MED ORDER — KETOROLAC TROMETHAMINE 60 MG/2ML IM SOLN
60.0000 mg | Freq: Once | INTRAMUSCULAR | Status: AC
  Administered 2015-12-13: 60 mg via INTRAMUSCULAR

## 2015-12-13 MED ORDER — METHYLPREDNISOLONE ACETATE 80 MG/ML IJ SUSP
120.0000 mg | Freq: Once | INTRAMUSCULAR | Status: AC
  Administered 2015-12-13: 120 mg via INTRAMUSCULAR

## 2015-12-13 MED ORDER — CELECOXIB 200 MG PO CAPS: 200.0000 mg | ORAL_CAPSULE | Freq: Two times a day (BID) | ORAL | Status: DC

## 2015-12-13 NOTE — Patient Instructions (Addendum)
   IF you received an x-ray today, you will receive an invoice from [facility]. Please contact [facility] at [phone] with questions or concerns regarding your invoice.   IF you received labwork today, you will receive an invoice from [facility]. Please contact [facility] at [phone] with questions or concerns regarding your invoice.   Our billing staff will not be able to assist you with questions regarding bills from these companies.  You will be contacted with the lab results as soon as they are available. The fastest way to get your results is to activate your My Chart account. Instructions are located on the last page of this paperwork. If you have not heard from us regarding the results in 2 weeks, please contact this office.   Back Pain, Adult Back pain is very common in adults.The cause of back pain is rarely dangerous and the pain often gets better over time.The cause of your back pain may not be known. Some common causes of back pain include:  Strain of the muscles or ligaments supporting the spine.  Wear and tear (degeneration) of the spinal disks.  Arthritis.  Direct injury to the back. For many people, back pain may return. Since back pain is rarely dangerous, most people can learn to manage this condition on their own. HOME CARE INSTRUCTIONS Watch your back pain for any changes. The following actions may help to lessen any discomfort you are feeling:  Remain active. It is stressful on your back to sit or stand in one place for long periods of time. Do not sit, drive, or stand in one place for more than 30 minutes at a time. Take short walks on even surfaces as soon as you are able.Try to increase the length of time you walk each day.  Exercise regularly as directed by your health care provider. Exercise helps your back heal faster. It also helps avoid future injury by keeping your muscles strong and flexible.  Do not  stay in bed.Resting more than 1-2 days can delay your recovery.  Pay attention to your body when you bend and lift. The most comfortable positions are those that put less stress on your recovering back. Always use proper lifting techniques, including:  Bending your knees.  Keeping the load close to your body.  Avoiding twisting.  Find a comfortable position to sleep. Use a firm mattress and lie on your side with your knees slightly bent. If you lie on your back, put a pillow under your knees.  Avoid feeling anxious or stressed.Stress increases muscle tension and can worsen back pain.It is important to recognize when you are anxious or stressed and learn ways to manage it, such as with exercise.  Take medicines only as directed by your health care provider. Over-the-counter medicines to reduce pain and inflammation are often the most helpful.Your health care provider may prescribe muscle relaxant drugs.These medicines help dull your pain so you can more quickly return to your normal activities and healthy exercise.  Apply ice to the injured area:  Put ice in a plastic bag.  Place a towel between your skin and the bag.  Leave the ice on for 20 minutes, 2-3 times a day for the first 2-3 days. After that, ice and heat may be alternated to reduce pain and spasms.  Maintain a healthy weight. Excess weight puts extra stress on your back and makes it difficult to maintain good posture. SEEK MEDICAL CARE IF:  You have pain that   is not relieved with rest or medicine.  You have increasing pain going down into the legs or buttocks.  You have pain that does not improve in one week.  You have night pain.  You lose weight.  You have a fever or chills. SEEK IMMEDIATE MEDICAL CARE IF:   You develop new bowel or bladder control problems.  You have unusual weakness or numbness in your arms or legs.  You develop nausea or vomiting.  You develop abdominal pain.  You feel faint.    This information is not intended to replace advice given to you by your health care provider. Make sure you discuss any questions you have with your health care provider.   Document Released: 05/31/2005 Document Revised: 06/21/2014 Document Reviewed: 10/02/2013 Elsevier Interactive Patient Education 2016 Elsevier Inc.  

## 2015-12-13 NOTE — Progress Notes (Signed)
HPI: Haley Carlson is a 50 y.o. female who presents [facility]/2015 for chief complaint of:  Chief Complaint  Patient presents with  . Back Pain    x 1 year    Patient presents to clinic today request seeing referral for back pain. This is a long-standing issue for her.  . Context: was hurt at work (lifting and injured it that way) about a year ago, Worker's Comp. was involved. She went to physical therapy for about 8 weeks, was seen by orthopedic specialist and got MRI of thoracic and lumbar spines which were both normal, was getting injections. She was frustrated with orthopedic specialist because they apparently told her that this was just something that she would have to live with. . Location: Worse in thoracic region, mild lumbar pain as well, denies radiation into arms or legs . Quality: Soreness, occasional tingling, no numbness in arms or legs . Severity: Gets worse throughout the day to the point where she is unable to work easily . Duration: Has been going on for about a year . Modifying factors: Was prescribed Tramadol which wasn't helpful. OTC meds such as ibuprofen, Tylenol, Advil, Aleve, multiple patches/creams aren't helping.     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 Implanon removal     PT HAD IMPLANON INSERTED 12/2009 AND REMOVED 03/17/10  . Depression   . Substance abuse     cocaine, marijuana, alcohol; sober since 10/2008   Past Surgical History  Procedure Laterality Date  . Endometrial ablation  11/2007    HER OPTION ABLATION  . External ear surgery  1993    CYST REMOVED  . Augmentation mammaplasty      IMPLANTS   Social History  Substance Use Topics  . Smoking status: Current Every Day Smoker -- 0.50 packs/day for 25  years    Types: Cigarettes  . Smokeless tobacco: Not on file     Comment: Vapor  . Alcohol Use: No     Comment: recovering alcoholic    Family History  Problem Relation Age of Onset  . Hypertension Mother   . Cancer Mother     breast cancer 2014  . Thyroid disease Mother     Hashimoto's  . Dementia Father     Current Outpatient Prescriptions  Medication Sig Dispense Refill  . albuterol (PROVENTIL HFA;VENTOLIN HFA) 108 (90 BASE) MCG/ACT inhaler Inhale 2 puffs into the lungs every 4 (four) hours as needed for wheezing or shortness of breath (cough, shortness of breath or wheezing.). 1 Inhaler 1  . aspirin 325 MG tablet Take 325 mg by mouth every 6 (six) hours as needed for mild pain or moderate pain. Reported on 07/27/2015    . escitalopram (LEXAPRO) 20 MG tablet Take 1 daily- take a 1/2 tablet for the first week of therapy 90 tablet 3  . Ibuprofen (ADVIL) 200 MG CAPS Take by mouth.    . traMADol (ULTRAM) 50 MG tablet Take by mouth every 12 (twelve) hours as needed.     No current facility-administered medications for this visit.   Allergies  Allergen Reactions  . Cymbalta [Duloxetine Hcl] Other (See Comments)    Pt states it makes her "feel crazy"       Review of Systems: CONSTITUTIONAL:  No  fever, no chills, No  unintentional weight changes HEAD/EYES/EARS/NOSE/THROAT: No  headache, no vision change CARDIAC: No  chest pain GASTROINTESTINAL: No  nausea, No  vomiting, No  abdominal pain MUSCULOSKELETAL: (+) Back pain as noted per history of present illness, patient reports no other myalgia/arthralgia NEUROLOGIC: No  weakness, No  Dizziness  Exam:  BP 104/70 mmHg  Pulse 75  Temp(Src) 98.1 F (36.7 C) (Oral)  Resp 16  Ht 5' 4.5" (1.638 m)  Wt 177 lb 6.4 oz (80.468 kg)  BMI 29.99 kg/m2  SpO2 98%  LMP 07/18/2011 Constitutional: VS see above. General Appearance: alert, well-developed, well-nourished, NAD Neck: No masses, trachea midline.  Respiratory: Normal respiratory  effort.   Musculoskeletal: Normal range of motion to flexion and extension and rotation of the spine, reports tenderness paraspinal muscles throughout thoracic back. Gait normal. No clubbing/cyanosis of digits.  Neurological: No cranial nerve deficit on limited exam. Motor and sensation intact and symmetric Skin: warm, dry, intact. No rash/ulcer. No concerning nevi or subq nodules on limited exam.   Psychiatric: Fair judgment/insight. Normal mood and affect. Oriented x3.       ASSESSMENT/PLAN: Advised patient that history of injury and subsequent chronic back pain may very well continue to be a problem for her on an off throughout her life. Given normal MRI, lack of radicular symptoms at this time or new weakness, and don't feel he needs repeat MRI. Patient requests referral to pain management. If patient does have a history of substance dependence, no opioid medications were prescribed for her today. Medications as below, patient declines referral to physical therapy at this time.  Bilateral thoracic back pain - Plan: ketorolac (TORADOL) injection 60 mg, methylPREDNISolone acetate (DEPO-MEDROL) injection 120 mg, celecoxib (CELEBREX) 200 MG capsule, Ambulatory referral to Pain Clinic, cyclobenzaprine (FLEXERIL) 5 MG tablet    Visit summary printed and instructions reviewed with the patient. ER/RTC precautions were reviewed. All questions answered. Return if symptoms worsen or fail to improve.

## 2015-12-13 NOTE — Telephone Encounter (Signed)
PA completed via [email or web address]: Celecoxib 200mg  #60 Sig: 1 cap PO BID

## 2015-12-24 ENCOUNTER — Telehealth: Payer: Self-pay

## 2015-12-24 NOTE — Telephone Encounter (Signed)
Patient is calling because in order to get the prescription for celebrex generic the insurance requires a coverage review. Please advise!  [employer] : [phone] Patient phone: [phone]

## 2015-12-30 NOTE — Telephone Encounter (Signed)
Done by Okey Dupreose, awaiting response.

## 2016-03-08 ENCOUNTER — Other Ambulatory Visit: Payer: Self-pay | Admitting: Family Medicine

## 2016-03-08 ENCOUNTER — Other Ambulatory Visit: Payer: Self-pay | Admitting: Emergency Medicine

## 2016-03-08 ENCOUNTER — Encounter: Payer: Self-pay | Admitting: Family Medicine

## 2016-03-08 DIAGNOSIS — F330 Major depressive disorder, recurrent, mild: Secondary | ICD-10-CM

## 2016-03-08 DIAGNOSIS — N951 Menopausal and female climacteric states: Secondary | ICD-10-CM

## 2016-03-08 MED ORDER — ESCITALOPRAM OXALATE 20 MG PO TABS: ORAL_TABLET | ORAL | 2 refills | Status: DC

## 2016-03-08 NOTE — Telephone Encounter (Signed)
..  lb

## 2016-03-08 NOTE — Telephone Encounter (Signed)
Received refill request for escitalopram (LEXAPRO). Last office visit and refill 01/29/15. Will it be ok to refill? Please advise.

## 2016-03-10 ENCOUNTER — Other Ambulatory Visit: Payer: Self-pay | Admitting: Family Medicine

## 2016-03-10 DIAGNOSIS — N951 Menopausal and female climacteric states: Secondary | ICD-10-CM

## 2016-03-10 DIAGNOSIS — F330 Major depressive disorder, recurrent, mild: Secondary | ICD-10-CM

## 2016-03-22 ENCOUNTER — Other Ambulatory Visit: Payer: Self-pay | Admitting: Family Medicine

## 2016-03-22 DIAGNOSIS — N951 Menopausal and female climacteric states: Secondary | ICD-10-CM

## 2016-03-22 DIAGNOSIS — F330 Major depressive disorder, recurrent, mild: Secondary | ICD-10-CM

## 2016-04-18 ENCOUNTER — Emergency Department (HOSPITAL_BASED_OUTPATIENT_CLINIC_OR_DEPARTMENT_OTHER)
Admission: EM | Admit: 2016-04-18 | Discharge: 2016-04-18 | Disposition: A | Discharge status: Home / Self Care | Payer: BLUE CROSS/BLUE SHIELD | Attending: Emergency Medicine | Admitting: Emergency Medicine

## 2016-04-18 ENCOUNTER — Emergency Department (HOSPITAL_BASED_OUTPATIENT_CLINIC_OR_DEPARTMENT_OTHER): Payer: BLUE CROSS/BLUE SHIELD

## 2016-04-18 ENCOUNTER — Encounter (HOSPITAL_BASED_OUTPATIENT_CLINIC_OR_DEPARTMENT_OTHER): Payer: Self-pay | Admitting: *Deleted

## 2016-04-18 DIAGNOSIS — Y999 Unspecified external cause status: Secondary | ICD-10-CM | POA: Insufficient documentation

## 2016-04-18 DIAGNOSIS — S299XXA Unspecified injury of thorax, initial encounter: Secondary | ICD-10-CM | POA: Diagnosis present

## 2016-04-18 DIAGNOSIS — N951 Menopausal and female climacteric states: Secondary | ICD-10-CM

## 2016-04-18 DIAGNOSIS — X58XXXA Exposure to other specified factors, initial encounter: Secondary | ICD-10-CM | POA: Diagnosis not present

## 2016-04-18 DIAGNOSIS — Y929 Unspecified place or not applicable: Secondary | ICD-10-CM | POA: Diagnosis not present

## 2016-04-18 DIAGNOSIS — F330 Major depressive disorder, recurrent, mild: Secondary | ICD-10-CM

## 2016-04-18 DIAGNOSIS — F17210 Nicotine dependence, cigarettes, uncomplicated: Secondary | ICD-10-CM | POA: Insufficient documentation

## 2016-04-18 DIAGNOSIS — S29011A Strain of muscle and tendon of front wall of thorax, initial encounter: Secondary | ICD-10-CM | POA: Insufficient documentation

## 2016-04-18 DIAGNOSIS — Y939 Activity, unspecified: Secondary | ICD-10-CM | POA: Insufficient documentation

## 2016-04-18 DIAGNOSIS — Z7982 Long term (current) use of aspirin: Secondary | ICD-10-CM | POA: Diagnosis not present

## 2016-04-18 HISTORY — DX: Supraventricular tachycardia: I47.1

## 2016-04-18 HISTORY — DX: Supraventricular tachycardia, unspecified: I47.10

## 2016-04-18 MED ORDER — ESCITALOPRAM OXALATE 20 MG PO TABS: ORAL_TABLET | ORAL | 0 refills | Status: DC

## 2016-04-18 MED ORDER — METHOCARBAMOL 500 MG PO TABS: 500.0000 mg | ORAL_TABLET | Freq: Three times a day (TID) | ORAL | 0 refills | Status: DC | PRN

## 2016-04-18 NOTE — ED Notes (Signed)
Returned from xray

## 2016-04-18 NOTE — ED Notes (Signed)
ED Provider at bedside. 

## 2016-04-18 NOTE — ED Provider Notes (Signed)
[facility] Provider Note   CSN: [id number] Arrival date & time: 04/18/16  0556     History   Chief Complaint Chief Complaint  Patient presents with  . rib pain    HPI Haley Carlson is a 50 y.o. female.  The history is provided by the patient. No language interpreter was used.    Haley Carlson is a 50 y.o. female who presents to the Emergency Department complaining of rib pain.  5 days ago she went to the chiropractor and when she had an adjustment she developed pain along her right axilla radiates around to the right breast. Pain is worse with movements and deep breaths. She has tried tramadol, Tylenol, ibuprofen at home with minimal improvement in her symptoms. No fevers, shortness of breath, normal pain, leg swelling or pain, numbness or weakness. Symptoms are mild to moderate, worsening.  Past Medical History:  Diagnosis Date  . Depression   . Implanon removal    PT HAD IMPLANON INSERTED 12/2009 AND REMOVED 03/17/10  . Substance abuse    cocaine, marijuana, alcohol; sober since 10/2008  . SVT (supraventricular tachycardia) [facility](HCC)     Patient Active Problem List   Diagnosis Date Noted  . Depression 06/29/2013  . History of substance abuse 06/29/2013    Past Surgical History:  Procedure Laterality Date  . AUGMENTATION MAMMAPLASTY     IMPLANTS  . ENDOMETRIAL ABLATION  11/2007   HER OPTION ABLATION  . EXTERNAL EAR SURGERY  1993   CYST REMOVED    OB History    No data available       Home Medications    Prior to Admission medications   Medication Sig Start Date End Date Taking? Authorizing Provider  albuterol (PROVENTIL HFA;VENTOLIN HFA) 108 (90 BASE) MCG/ACT inhaler Inhale 2 puffs into the lungs every 4 (four) hours as needed for wheezing or shortness of breath (cough, shortness of breath or wheezing.). 09/05/13   Godfrey PickEleanore E Egan, PA-C  aspirin 325 MG tablet Take 325 mg by mouth every 6 (six) hours as needed for mild pain or moderate pain.  Reported on 07/27/2015    Historical Provider, MD  celecoxib (CELEBREX) 200 MG capsule Take 1 capsule (200 mg total) by mouth 2 (two) times daily. 12/13/15   Sunnie NielsenNatalie Alexander, DO  cyclobenzaprine (FLEXERIL) 5 MG tablet Take 1 tablet (5 mg total) by mouth 3 (three) times daily as needed for muscle spasms. 12/13/15   Sunnie NielsenNatalie Alexander, DO  escitalopram (LEXAPRO) 20 MG tablet Take 1 daily 04/18/16   Tilden FossaElizabeth Kethan Papadopoulos, MD  Ibuprofen (ADVIL) 200 MG CAPS Take by mouth.    Historical Provider, MD  methocarbamol (ROBAXIN) 500 MG tablet Take 1 tablet (500 mg total) by mouth every 8 (eight) hours as needed for muscle spasms. 04/18/16   Tilden FossaElizabeth Chrysten Woulfe, MD  traMADol (ULTRAM) 50 MG tablet Take by mouth every 12 (twelve) hours as needed.    Historical Provider, MD    Family History Family History  Problem Relation Age of Onset  . Hypertension Mother   . Cancer Mother     breast cancer 2014  . Thyroid disease Mother     Hashimoto's  . Dementia Father     Social History Social History  Substance Use Topics  . Smoking status: Current Every Day Smoker    Packs/day: 0.50    Years: 25.00    Types: Cigarettes  . Smokeless tobacco: Never Used     Comment: Vapor  . Alcohol use No  Comment: recovering alcoholic      Allergies   Cymbalta [duloxetine hcl]   Review of Systems Review of Systems  All other systems reviewed and are negative.    Physical Exam Updated Vital Signs BP 120/78 (BP Location: Left Arm)   Pulse 73   Temp 98 F (36.7 C) (Oral)   Resp 16   Ht 5\' 5"  (1.651 m)   Wt 180 lb (81.6 kg)   LMP 07/18/2011   SpO2 100%   BMI 29.95 kg/m   Physical Exam  Constitutional: She is oriented to person, place, and time. She appears well-developed and well-nourished.  HENT:  Head: Normocephalic and atraumatic.  Cardiovascular: Normal rate and regular rhythm.   No murmur heard. Pulmonary/Chest: Effort normal and breath sounds normal. No respiratory distress.  Mild tenderness over the  right mid axillary line and the right lateral breast. No rash or deformity.  Abdominal: Soft. There is no tenderness. There is no rebound and no guarding.  Musculoskeletal: She exhibits no edema or tenderness.  Neurological: She is alert and oriented to person, place, and time.  Skin: Skin is warm and dry.  Psychiatric: She has a normal mood and affect. Her behavior is normal.  Nursing note and vitals reviewed.    ED Treatments / Results  Labs (all labs ordered are listed, but only abnormal results are displayed) Labs Reviewed - No data to display  EKG  EKG Interpretation None       Radiology Dg Ribs Unilateral W/chest Right  Result Date: 04/18/2016 CLINICAL DATA:  Right anterior rib pain for 5 days. EXAM: RIGHT RIBS AND CHEST - 3+ VIEW COMPARISON:  None. FINDINGS: No fracture or other bone lesions are seen involving the ribs. There is no evidence of pneumothorax or pleural effusion. Both lungs are clear. Heart size and mediastinal contours are within normal limits. IMPRESSION: Negative. Electronically Signed   By: Ellery Plunkaniel R Mitchell M.D.   On: 04/18/2016 07:01    Procedures Procedures (including critical care time)  Medications Ordered in ED Medications - No data to display   Initial Impression / Assessment and Plan / ED Course  I have reviewed the triage vital signs and the nursing notes.  Pertinent labs & imaging results that were available during my care of the patient were reviewed by me and considered in my medical decision making (see chart for details).  Clinical Course     Pt Here for right chest wall pain following chiropractic adjustment. She is neurovascularly intact on exam with no distress. No evidence of acute fracture, pneumothorax. Presentation is not c/w cholecystitis, PE.  Discussed home care for muscle strain, outpatient follow up, return precautions.    Final Clinical Impressions(s) / ED Diagnoses   Final diagnoses:  Muscle strain of chest wall,  initial encounter    New Prescriptions New Prescriptions   METHOCARBAMOL (ROBAXIN) 500 MG TABLET    Take 1 tablet (500 mg total) by mouth every 8 (eight) hours as needed for muscle spasms.     Tilden FossaElizabeth Caeleigh Prohaska, MD 04/18/16 [phone]

## 2016-04-18 NOTE — ED Triage Notes (Signed)
Pt c/o right anterior rib pain that stated on Tues after going to the chiropractor. States she was seen again on Thursday and states pain has increased. States she has taken tramdol for pain without relief. Denies any sob resp even and unlabored

## 2016-06-10 ENCOUNTER — Ambulatory Visit: Payer: Self-pay | Admitting: Family Medicine

## 2016-06-28 ENCOUNTER — Telehealth: Payer: Self-pay

## 2016-06-28 NOTE — Telephone Encounter (Signed)
Left message for patient to return call regarding pre- visit information.  

## 2016-06-29 ENCOUNTER — Encounter: Payer: Self-pay | Admitting: Family

## 2016-06-29 ENCOUNTER — Ambulatory Visit (INDEPENDENT_AMBULATORY_CARE_PROVIDER_SITE_OTHER): Payer: BLUE CROSS/BLUE SHIELD | Admitting: Family

## 2016-06-29 ENCOUNTER — Ambulatory Visit (HOSPITAL_BASED_OUTPATIENT_CLINIC_OR_DEPARTMENT_OTHER)
Admission: RE | Admit: 2016-06-29 | Discharge: 2016-06-29 | Disposition: A | Discharge status: Home / Self Care | Payer: BLUE CROSS/BLUE SHIELD | Source: Ambulatory Visit | Attending: Family | Admitting: Family

## 2016-06-29 DIAGNOSIS — F330 Major depressive disorder, recurrent, mild: Secondary | ICD-10-CM | POA: Diagnosis not present

## 2016-06-29 DIAGNOSIS — F32A Depression, unspecified: Secondary | ICD-10-CM

## 2016-06-29 DIAGNOSIS — N951 Menopausal and female climacteric states: Secondary | ICD-10-CM

## 2016-06-29 DIAGNOSIS — M546 Pain in thoracic spine: Secondary | ICD-10-CM | POA: Diagnosis not present

## 2016-06-29 DIAGNOSIS — G8929 Other chronic pain: Secondary | ICD-10-CM

## 2016-06-29 DIAGNOSIS — F329 Major depressive disorder, single episode, unspecified: Secondary | ICD-10-CM

## 2016-06-29 DIAGNOSIS — M722 Plantar fascial fibromatosis: Secondary | ICD-10-CM

## 2016-06-29 DIAGNOSIS — M542 Cervicalgia: Secondary | ICD-10-CM | POA: Diagnosis not present

## 2016-06-29 DIAGNOSIS — M4802 Spinal stenosis, cervical region: Secondary | ICD-10-CM | POA: Diagnosis not present

## 2016-06-29 MED ORDER — MELOXICAM 7.5 MG PO TABS: 7.5000 mg | ORAL_TABLET | Freq: Every day | ORAL | 2 refills | Status: DC

## 2016-06-29 MED ORDER — METHYLPREDNISOLONE 4 MG PO TBPK: ORAL_TABLET | ORAL | 0 refills | Status: DC

## 2016-06-29 MED ORDER — ESCITALOPRAM OXALATE 20 MG PO TABS: ORAL_TABLET | ORAL | 5 refills | Status: DC

## 2016-06-29 NOTE — Assessment & Plan Note (Signed)
Due to radiculopathy, will rx with medrol dose pak.  An x ray of the c spine is performed and notes:  Mild disc narrowing C5-C7 with endplate spurring.

## 2016-06-29 NOTE — Assessment & Plan Note (Signed)
Stable on lexapro, continue same.  

## 2016-06-29 NOTE — Progress Notes (Signed)
Pre visit review using our clinic review tool, if applicable. No additional management support is needed unless otherwise documented below in the visit note. 

## 2016-06-29 NOTE — Assessment & Plan Note (Signed)
I suggested that she consider having some custom orthotics made. She will check if this is covered by her insurance and call me if she needs a referral.

## 2016-06-29 NOTE — Patient Instructions (Signed)
Begin daily meloxicam. Begin medrol dosepak. You will be contacted about your referral to physical therapy. Continue to follow with Dr. Regino SchultzeWang.  In the meantime, we will work on referral to another pain clinic. Continue lexapro.

## 2016-06-29 NOTE — Progress Notes (Signed)
Subjective:    Patient ID: Haley Carlson, female    DOB: 05-27-1966, 51 y.o.   MRN: [id number]  HPI  Haley Carlson is a 51 yr old female who presents today to establish care.  Her chief complaint today is new neck pain and chronic, uncontrolled back pain.  I see that she had a visit in our system on 12/13/15 for back pain with Dr. Sunnie Nielsen. Note is reviewed.  She apparently hurt her back about 1.5 years ago at work and was followed under [employer].  She apparently had normal MRI of the thoracic and lumbar spines at that time.    Reviewed [place] controlled substance registry.  Patient has been filling Tramadol monthly, dispensed by Dr. Regino Schultze #60, last refill was 06/08/16.  Dr. Wynema Birch Want is a pain specialist who specializes in low back pain.  She is now a Holiday representative at [employer]. She is using a hot patch and a tens unit.  She reports a new pain in the back of the neck.  Reports that for the last two weeks she has pain which radiates down her right arm.  She has tried multiple OTC meds.  Uses BC arthritis which helps with her joint pain.  Reports that her back pain is located in the thoracic spine.  Pain is generally non-radiating.  Her last MRI was >1 year ago.  MRI was reviewed and was essentially unremarkable.   She does have a history of depression.  She reports that her depression is stable on lexapro.    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  Had a steroid shot and has "so much pain." Standing is a problem for her.    Review of Systems See HPI  Past Medical History:  Diagnosis Date  . Depression   . Implanon removal    PT HAD IMPLANON INSERTED 12/2009 AND REMOVED 03/17/10  . Substance abuse    cocaine, marijuana, alcohol; sober since 10/2008  . SVT (supraventricular tachycardia) (HCC)      Social History   Social History  . Marital status: Legally Separated    Spouse name: N/A  . Number of children: N/A  . Years of education: N/A   Occupational History  . Not on file.   Social  History Main Topics  . Smoking status: Current Every Day Smoker    Packs/day: 0.50    Years: 25.00    Types: Cigarettes  . Smokeless tobacco: Never Used  . Alcohol use No     Comment: recovering alcoholic   . Drug use: No  . Sexual activity: Yes    Partners: Male    Birth control/ protection: Post-menopausal   Other Topics Concern  . Not on file   Social History Narrative  . No narrative on file    Past Surgical History:  Procedure Laterality Date  . AUGMENTATION MAMMAPLASTY     IMPLANTS  . ENDOMETRIAL ABLATION  11/2007   HER OPTION ABLATION  . EXTERNAL EAR SURGERY  1993   CYST REMOVED    Family History  Problem Relation Age of Onset  . Hypertension Mother   . Cancer Mother     breast cancer 2014  . Thyroid disease Mother     Hashimoto's  . Dementia Father     Allergies  Allergen Reactions  . Cymbalta [Duloxetine Hcl] Other (See Comments)    Pt states it makes her "feel crazy"     Current Outpatient Prescriptions on File Prior to Visit  Medication Sig Dispense Refill  .  escitalopram (LEXAPRO) 20 MG tablet Take 1 daily 30 tablet 0  . Ibuprofen (ADVIL) 200 MG CAPS Take by mouth.    . traMADol (ULTRAM) 50 MG tablet Take by mouth every 12 (twelve) hours as needed.     No current facility-administered medications on file prior to visit.     BP 100/78   Pulse 74   Temp 97.6 F (36.4 C) (Oral)   Resp 16   Ht 5\' 5"  (1.651 m)   Wt 176 lb 6.4 oz (80 kg)   LMP 07/18/2011   SpO2 97% Comment: room air  BMI 29.35 kg/m       Objective:   Physical Exam  Constitutional: She appears well-developed and well-nourished.  HENT:  Head: Normocephalic and atraumatic.  Cardiovascular: Normal rate, regular rhythm and normal heart sounds.   No murmur heard. Pulmonary/Chest: Effort normal and breath sounds normal. No respiratory distress. She has no wheezes.  Musculoskeletal:       Cervical back: She exhibits no tenderness.       Thoracic back: She exhibits no  tenderness.       Lumbar back: She exhibits no tenderness.  Some tender nodules noted on bilateral plantar surfaces.  Neurological:  Reflex Scores:      Bicep reflexes are 2+ on the right side and 2+ on the left side.      Brachioradialis reflexes are 2+ on the right side and 2+ on the left side.      Patellar reflexes are 3+ on the right side and 3+ on the left side. Skin: Skin is warm and dry.  Psychiatric: She has a normal mood and affect. Her behavior is normal. Judgment and thought content normal.          Assessment & Plan:  Chronic Thoracic back pain- She would like a referral to a different pain management provider.  Will place referral. In the meantime, I would recommended that she continue to follow up with Dr. Regino SchultzeWang for her tramadol refills.  Will add meloxicam (advised her to avoid use of ibuprofen and BC's), refer for physical therapy.   30 minutes spent with pt today. >50% of this time was spent counseling patient on her pain and treatment option for her pain.

## 2016-07-02 ENCOUNTER — Telehealth: Payer: Self-pay | Admitting: Family

## 2016-07-02 NOTE — Telephone Encounter (Signed)
Notified pt per 06/29/16 patient email and she voices understanding.

## 2016-07-02 NOTE — Telephone Encounter (Signed)
Relation to ZO:XWRUpt:self Call back number:[phone]   Reason for call:  Patient inquiring about imaging results, please leave detail message due to patient being at work until 2:30pm

## 2016-07-07 ENCOUNTER — Telehealth: Payer: Self-pay | Admitting: Family

## 2016-07-07 NOTE — Telephone Encounter (Signed)
Unfortunately, it is not a good idea to continue long term due to risks related to long term use.  I would recommend that she continue meloxicam.

## 2016-07-07 NOTE — Telephone Encounter (Signed)
Notified pt and she voices understanding. 

## 2016-07-07 NOTE — Telephone Encounter (Signed)
Pt says that she was prescribed methylPREDNISolone.  Pt says that it helped a lot. She says that she is now off or 2 days and is experiencing pain again. She would like to know If provider could write Rx so that she could take a low dosage everyday?   Please advise   CB: [phone]   Pharmacy: [facility] [address] - [place], [place] - [id number]

## 2016-07-23 ENCOUNTER — Telehealth: Payer: Self-pay | Admitting: Family

## 2016-07-23 NOTE — Telephone Encounter (Signed)
Pt dropped off document to be filled out by provider Damaris Hippo([facility]) pt would like document to be faxed to [phone] and pt would like to be called when document faxed in. Document put at front office tray.

## 2016-07-30 NOTE — Telephone Encounter (Signed)
Form received and forwarded to PCP for review and completion.   

## 2016-08-02 NOTE — Telephone Encounter (Signed)
Form faxed to [facility] at [phone]. Message sent to pt.

## 2016-09-27 ENCOUNTER — Encounter: Payer: Self-pay | Admitting: Family

## 2016-09-27 ENCOUNTER — Ambulatory Visit (INDEPENDENT_AMBULATORY_CARE_PROVIDER_SITE_OTHER): Payer: BLUE CROSS/BLUE SHIELD | Admitting: Family

## 2016-09-27 ENCOUNTER — Other Ambulatory Visit (HOSPITAL_COMMUNITY)
Admission: RE | Admit: 2016-09-27 | Discharge: 2016-09-27 | Disposition: A | Discharge status: Home / Self Care | Payer: BLUE CROSS/BLUE SHIELD | Source: Ambulatory Visit | Attending: Family | Admitting: Family

## 2016-09-27 VITALS — BP 108/75 | HR 71 | Temp 98.3°F | Resp 18 | Ht 65.0 in | Wt 175.4 lb

## 2016-09-27 DIAGNOSIS — Z720 Tobacco use: Secondary | ICD-10-CM

## 2016-09-27 DIAGNOSIS — F329 Major depressive disorder, single episode, unspecified: Secondary | ICD-10-CM | POA: Diagnosis not present

## 2016-09-27 DIAGNOSIS — M2550 Pain in unspecified joint: Secondary | ICD-10-CM | POA: Diagnosis not present

## 2016-09-27 DIAGNOSIS — Z23 Encounter for immunization: Secondary | ICD-10-CM

## 2016-09-27 DIAGNOSIS — E2839 Other primary ovarian failure: Secondary | ICD-10-CM

## 2016-09-27 DIAGNOSIS — Z01419 Encounter for gynecological examination (general) (routine) without abnormal findings: Secondary | ICD-10-CM | POA: Diagnosis not present

## 2016-09-27 DIAGNOSIS — F32A Depression, unspecified: Secondary | ICD-10-CM

## 2016-09-27 DIAGNOSIS — Z0000 Encounter for general adult medical examination without abnormal findings: Secondary | ICD-10-CM | POA: Diagnosis not present

## 2016-09-27 LAB — URINALYSIS, ROUTINE W REFLEX MICROSCOPIC
Bilirubin Urine: NEGATIVE
HGB URINE DIPSTICK: NEGATIVE
KETONES UR: NEGATIVE
LEUKOCYTES UA: NEGATIVE
NITRITE: NEGATIVE
RBC / HPF: NONE SEEN (ref 0–?)
SPECIFIC GRAVITY, URINE: 1.01 (ref 1.000–1.030)
Total Protein, Urine: NEGATIVE
Urine Glucose: NEGATIVE
Urobilinogen, UA: 0.2 (ref 0.0–1.0)
WBC UA: NONE SEEN (ref 0–?)
pH: 6.5 (ref 5.0–8.0)

## 2016-09-27 LAB — SEDIMENTATION RATE: Sed Rate: 16 mm/hr (ref 0–20)

## 2016-09-27 LAB — BASIC METABOLIC PANEL
BUN: 16 mg/dL (ref 6–23)
CO2: 28 mEq/L (ref 19–32)
CREATININE: 0.68 mg/dL (ref 0.40–1.20)
Calcium: 9.4 mg/dL (ref 8.4–10.5)
Chloride: 105 mEq/L (ref 96–112)
GFR: 97.41 mL/min (ref >60.00)
GLUCOSE: 89 mg/dL (ref 70–99)
Potassium: 4.2 mEq/L (ref 3.5–5.1)
Sodium: 138 mEq/L (ref 135–145)

## 2016-09-27 LAB — CBC WITH DIFFERENTIAL/PLATELET
BASOS PCT: 1 % (ref 0.0–3.0)
Basophils Absolute: 0.1 10*3/uL (ref 0.0–0.1)
Eosinophils Absolute: 0.1 10*3/uL (ref 0.0–0.7)
Eosinophils Relative: 1.8 % (ref 0.0–5.0)
HCT: 39.2 % (ref 36.0–46.0)
Hemoglobin: 12.9 g/dL (ref 12.0–15.0)
LYMPHS ABS: 1.8 10*3/uL (ref 0.7–4.0)
Lymphocytes Relative: 29.1 % (ref 12.0–46.0)
MCHC: 32.9 g/dL (ref 30.0–36.0)
MCV: 87.5 fl (ref 78.0–100.0)
MONO ABS: 0.6 10*3/uL (ref 0.1–1.0)
Monocytes Relative: 9.3 % (ref 3.0–12.0)
NEUTROS ABS: 3.7 10*3/uL (ref 1.4–7.7)
NEUTROS PCT: 58.8 % (ref 43.0–77.0)
Platelets: 276 10*3/uL (ref 150.0–400.0)
RBC: 4.49 Mil/uL (ref 3.87–5.11)
RDW: 15.1 % (ref 11.5–15.5)
WBC: 6.2 10*3/uL (ref 4.0–10.5)

## 2016-09-27 LAB — LIPID PANEL
CHOLESTEROL: 261 mg/dL — AB (ref 0–200)
HDL: 68 mg/dL (ref >39.00)
LDL Cholesterol: 169 mg/dL — ABNORMAL HIGH (ref 0–99)
NonHDL: 193.28
TRIGLYCERIDES: 119 mg/dL (ref 0.0–149.0)
Total CHOL/HDL Ratio: 4
VLDL: 23.8 mg/dL (ref 0.0–40.0)

## 2016-09-27 LAB — TSH: TSH: 1.93 u[IU]/mL (ref 0.35–4.50)

## 2016-09-27 LAB — HEPATIC FUNCTION PANEL
ALT: 12 U/L (ref 0–35)
AST: 14 U/L (ref 0–37)
Albumin: 4.3 g/dL (ref 3.5–5.2)
Alkaline Phosphatase: 56 U/L (ref 39–117)
BILIRUBIN TOTAL: 0.4 mg/dL (ref 0.2–1.2)
Bilirubin, Direct: 0.1 mg/dL (ref 0.0–0.3)
Total Protein: 6.8 g/dL (ref 6.0–8.3)

## 2016-09-27 NOTE — Progress Notes (Signed)
Pre visit review using our clinic review tool, if applicable. No additional management support is needed unless otherwise documented below in the visit note. 

## 2016-09-27 NOTE — Patient Instructions (Addendum)
Please complete lab work prior to leaving. Try adding some water exercises such as swimming or water aerobics. Work on quitting smoking.

## 2016-09-27 NOTE — Progress Notes (Signed)
Subjective:    Patient ID: Haley Carlson, female    DOB: 12-12-1965, 51 y.o.   MRN: [id number]  HPI  Patient presents today for complete physical.  Immunizations: Tetanus is due Diet: reports that her diet is generally healthy Exercise: not exercising regular due to her pain in her feet Colonoscopy: due Dexa: due Pap Smear: 5 years LMP Mammogram: due Wt Readings from Last 3 Encounters:  09/27/16 175 lb 6.4 oz (79.6 kg)  06/29/16 176 lb 6.4 oz (80 kg)  04/18/16 180 lb (81.6 kg)   Depression- attributes to pain. Has cut back her hours at work.  Sleep is ok. Denies SI/HI. "I don't feel any joy."    Joint pain- multiple joints- see below.   Meloxicam did not help.  Uses a cane sometimes.  Tobacco abuse- 12 cigarettes a day.    Review of Systems  Constitutional: Negative for unexpected weight change.  HENT: Negative for hearing loss and rhinorrhea.   Eyes: Negative for visual disturbance.       Reports dry eyes  Respiratory: Negative for cough.   Cardiovascular: Negative for leg swelling.  Gastrointestinal: Negative for constipation and diarrhea.  Genitourinary: Negative for dysuria and frequency.  Musculoskeletal:       Hips, ankles, knees, hips back , neck pain  Neurological: Negative for headaches.  Hematological: Negative for adenopathy.  Psychiatric/Behavioral:       See HPI   Past Medical History:  Diagnosis Date  . Depression   . Implanon removal    PT HAD IMPLANON INSERTED 12/2009 AND REMOVED 03/17/10  . Substance abuse    cocaine, marijuana, alcohol; sober since 10/2008  . SVT (supraventricular tachycardia) (HCC)      Social History   Social History  . Marital status: Legally Separated    Spouse name: N/A  . Number of children: N/A  . Years of education: N/A   Occupational History  . Not on file.   Social History Main Topics  . Smoking status: Current Every Day Smoker    Packs/day: 0.50    Years: 25.00    Types: Cigarettes  . Smokeless  tobacco: Never Used  . Alcohol use No     Comment: recovering alcoholic   . Drug use: No  . Sexual activity: Yes    Partners: Male    Birth control/ protection: Post-menopausal   Other Topics Concern  . Not on file   Social History Narrative   Works at [employer] as a Tourist information centre manager   Married to her ex husband, has a boyfriend of 5 years   3 children   1998- son, at [place]   2001-son local    2004- daughter    One dog ("sancho")    Past Surgical History:  Procedure Laterality Date  . AUGMENTATION MAMMAPLASTY  2009   IMPLANTS  . ENDOMETRIAL ABLATION  11/2007   HER OPTION ABLATION  . EXTERNAL EAR SURGERY  1993   CYST REMOVED    Family History  Problem Relation Age of Onset  . Hypertension Mother   . Cancer Mother     breast cancer 2014  . Thyroid disease Mother     Hashimoto's  . Dementia Father     Allergies  Allergen Reactions  . Cymbalta [Duloxetine Hcl] Other (See Comments)    Pt states it makes her "feel crazy"     Current Outpatient Prescriptions on File Prior to Visit  Medication Sig Dispense Refill  . escitalopram (LEXAPRO) 20 MG tablet Take  1 daily 30 tablet 5  . meloxicam (MOBIC) 7.5 MG tablet Take 1 tablet (7.5 mg total) by mouth daily. 30 tablet 2  . Misc Natural Products (TURMERIC CURCUMIN) CAPS Take by mouth.    . NON FORMULARY KRATOM as needed for pain    . OVER THE COUNTER MEDICATION ALPHA LIPOIC ACID.    Marland Kitchen OVER THE COUNTER MEDICATION BC ARTHRITIS    . GABAPENTIN PO Take 1 tablet by mouth at bedtime.     No current facility-administered medications on file prior to visit.     BP 108/75 (BP Location: Right Arm, Cuff Size: Normal)   Pulse 71   Temp 98.3 F (36.8 C) (Oral)   Resp 18   Ht '5\' 5"'  (1.651 m)   Wt 175 lb 6.4 oz (79.6 kg)   LMP 07/18/2011   SpO2 100% Comment: room air  BMI 29.19 kg/m        Objective:   Physical Exam  Constitutional: She is oriented to person, place, and time. She appears well-developed and well-nourished.   HENT:  Head: Normocephalic and atraumatic.  Eyes: EOM are normal. Pupils are equal, round, and reactive to light.  Cardiovascular: Normal rate, regular rhythm and normal heart sounds.   No murmur heard. Pulmonary/Chest: Effort normal and breath sounds normal. No respiratory distress. She has no wheezes.  Abdominal: Soft. Bowel sounds are normal.  Musculoskeletal: She exhibits no edema.  Neurological: She is alert and oriented to person, place, and time. She exhibits normal muscle tone.  Reflex Scores:      Patellar reflexes are 2+ on the right side and 2+ on the left side. Skin: Skin is warm and dry.  Psychiatric: She has a normal mood and affect. Her behavior is normal. Judgment and thought content normal.  Breast exam: bilateral implants, no obvious masses noted        Assessment & Plan:   Tobacco abuse- discussed importance of cessation.   Depression- we discussed possibility of adding another medication for her depression. She declines at this time as she really feels that her depression is stemming from her pain.    Joint pain- obtain RA, ANA, ESR. If abnormal- discussed referral to rheumatology.   Preventative Care- EKG tracing is personally reviewed.  EKG notes NSR.  No acute changes.  discussed adding some water exercise for her joint pain. Refer for colo, mammo. Td today. Obtain routine lab work. Will also obtain dexa due to smoking hx and since she is 5 years pot menopausal.

## 2016-09-28 LAB — RHEUMATOID FACTOR: Rhuematoid fact SerPl-aCnc: <14 IU/mL (ref <14)

## 2016-09-28 LAB — ANA: Anti Nuclear Antibody(ANA): NEGATIVE

## 2016-09-29 LAB — CYTOLOGY - PAP
DIAGNOSIS: NEGATIVE
HPV (WINDOPATH): NOT DETECTED

## 2016-09-30 ENCOUNTER — Encounter: Payer: Self-pay | Admitting: Family

## 2016-10-12 ENCOUNTER — Encounter (HOSPITAL_BASED_OUTPATIENT_CLINIC_OR_DEPARTMENT_OTHER): Payer: Self-pay

## 2016-10-12 ENCOUNTER — Ambulatory Visit (HOSPITAL_BASED_OUTPATIENT_CLINIC_OR_DEPARTMENT_OTHER)
Admission: RE | Admit: 2016-10-12 | Discharge: 2016-10-12 | Disposition: A | Discharge status: Home / Self Care | Payer: BLUE CROSS/BLUE SHIELD | Source: Ambulatory Visit | Attending: Family | Admitting: Family

## 2016-10-12 DIAGNOSIS — Z0000 Encounter for general adult medical examination without abnormal findings: Secondary | ICD-10-CM

## 2016-10-12 DIAGNOSIS — M85851 Other specified disorders of bone density and structure, right thigh: Secondary | ICD-10-CM | POA: Diagnosis not present

## 2016-10-12 DIAGNOSIS — Z1231 Encounter for screening mammogram for malignant neoplasm of breast: Secondary | ICD-10-CM | POA: Insufficient documentation

## 2016-10-12 DIAGNOSIS — M8588 Other specified disorders of bone density and structure, other site: Secondary | ICD-10-CM | POA: Diagnosis not present

## 2016-10-12 DIAGNOSIS — E2839 Other primary ovarian failure: Secondary | ICD-10-CM | POA: Diagnosis present

## 2016-10-15 ENCOUNTER — Other Ambulatory Visit: Payer: Self-pay | Admitting: Family

## 2016-10-15 ENCOUNTER — Encounter: Payer: Self-pay | Admitting: Family

## 2016-10-15 DIAGNOSIS — M85859 Other specified disorders of bone density and structure, unspecified thigh: Secondary | ICD-10-CM

## 2016-10-15 DIAGNOSIS — M8580 Other specified disorders of bone density and structure, unspecified site: Secondary | ICD-10-CM

## 2016-10-15 HISTORY — DX: Other specified disorders of bone density and structure, unspecified site: M85.80

## 2016-10-15 MED ORDER — CALCIUM CARBONATE-VITAMIN D 600-400 MG-UNIT PO TABS: 1.0000 | ORAL_TABLET | Freq: Two times a day (BID) | ORAL | Status: DC

## 2016-11-18 ENCOUNTER — Encounter: Payer: Self-pay | Admitting: Family

## 2017-01-25 ENCOUNTER — Ambulatory Visit: Payer: Self-pay | Admitting: Family

## 2017-01-28 ENCOUNTER — Ambulatory Visit: Payer: Self-pay | Admitting: Family

## 2017-01-28 DIAGNOSIS — Z0289 Encounter for other administrative examinations: Secondary | ICD-10-CM

## 2017-02-11 ENCOUNTER — Ambulatory Visit (INDEPENDENT_AMBULATORY_CARE_PROVIDER_SITE_OTHER): Payer: BLUE CROSS/BLUE SHIELD | Admitting: Family

## 2017-02-11 ENCOUNTER — Encounter: Payer: Self-pay | Admitting: Family

## 2017-02-11 DIAGNOSIS — N951 Menopausal and female climacteric states: Secondary | ICD-10-CM

## 2017-02-11 DIAGNOSIS — F330 Major depressive disorder, recurrent, mild: Secondary | ICD-10-CM | POA: Diagnosis not present

## 2017-02-11 DIAGNOSIS — M797 Fibromyalgia: Secondary | ICD-10-CM | POA: Insufficient documentation

## 2017-02-11 MED ORDER — ESCITALOPRAM OXALATE 20 MG PO TABS: ORAL_TABLET | ORAL | 5 refills | Status: DC

## 2017-02-11 MED ORDER — DULOXETINE HCL 30 MG PO CPEP: 30.0000 mg | ORAL_CAPSULE | Freq: Every day | ORAL | 2 refills | Status: DC

## 2017-02-11 NOTE — Patient Instructions (Signed)
Please start cymbalta 30mg  once daily.

## 2017-02-11 NOTE — Addendum Note (Signed)
Addended by: Sandford Craze'SULLIVAN, Pieper Kasik on: 02/11/2017 02:33 PM   Modules accepted: Level of Service

## 2017-02-11 NOTE — Assessment & Plan Note (Signed)
I think her pain is likely related to fibromyalgia.  Will give trial of low dose cymbalta to see if this helps her pain.

## 2017-02-11 NOTE — Progress Notes (Signed)
Subjective:    Patient ID: Haley Carlson, female    DOB: [birth year], 51 y.o.   MRN: [id number]  HPI   Pt presents today for follow up.  Reports a lot of muscular pain. Went to the pain clinic and had a shot which helped briefly, second shot did not help. Reports that prolonged standing/chores increased her muscle pain, heating pad helps.  Pain is chronic and really limits her quality of life.  Feels that rest and heat are the only things that help her pain.  Autoimmune work up was negative last visit.   Depression-Reports that emotionally she is feeling better than last visit. Just frustrated with her ongoing myalgia.  Reports that she had a rash on both elbows which is resolving.    Review of Systems    see HPI  Past Medical History:  Diagnosis Date  . Depression   . Implanon removal    PT HAD IMPLANON INSERTED 12/2009 AND REMOVED 03/17/10  . Osteopenia 10/15/2016   T score -2.4 10/15/16  . Substance abuse    cocaine, marijuana, alcohol; sober since 10/2008  . SVT (supraventricular tachycardia) (HCC)      Social History   Social History  . Marital status: Legally Separated    Spouse name: N/A  . Number of children: N/A  . Years of education: N/A   Occupational History  . Not on file.   Social History Main Topics  . Smoking status: Current Every Day Smoker    Packs/day: 0.50    Years: 25.00    Types: Cigarettes  . Smokeless tobacco: Never Used  . 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 Drug use: No     Comment: remote use cocain, methamphetamine, cocaine, marijuana  . Sexual activity: Yes    Partners: Male    Birth control/ protection: Post-menopausal   Other Topics Concern  . Not on file   Social History Narrative   Works at [employer] as a Holiday representative   Married to her ex husband, has a boyfriend of 5 years   3 children   1998- son, at [employer] [place]   2001-son local    2004- daughter    One dog ("sancho")    Past Surgical History:    Procedure Laterality Date  . AUGMENTATION MAMMAPLASTY  2009   IMPLANTS  . ENDOMETRIAL ABLATION  11/2007   HER OPTION ABLATION  . EXTERNAL EAR SURGERY  1993   CYST REMOVED    Family History  Problem Relation Age of Onset  . Hypertension Mother   . Cancer Mother        breast cancer 2014  . Thyroid disease Mother        Hashimoto's  . Dementia Father     Allergies  Allergen Reactions  . Cymbalta [Duloxetine Hcl] Other (See Comments)    Pt states it makes her "feel crazy"     Current Outpatient Prescriptions on File Prior to Visit  Medication Sig Dispense Refill  . BLACK COHOSH PO Take 1 tablet by mouth daily.    . Calcium Carbonate-Vitamin D (CALTRATE 600+D) 600-400 MG-UNIT tablet Take 1 tablet by mouth 2 (two) times daily.    Marland Kitchen escitalopram (LEXAPRO) 20 MG tablet Take 1 daily 30 tablet 5  . GABAPENTIN PO Take 1 tablet by mouth at bedtime.    Marland Kitchen glucosamine-chondroitin 500-400 MG tablet Take 2 tablets by mouth every morning.    Marland Kitchen ibuprofen (ADVIL,MOTRIN) 200 MG tablet  Take 400 mg by mouth 4 (four) times daily.    Marland Kitchen. MAGNESIUM PO Take 1 tablet by mouth daily.    . meloxicam (MOBIC) 7.5 MG tablet Take 1 tablet (7.5 mg total) by mouth daily. 30 tablet 2  . Misc Natural Products (TURMERIC CURCUMIN) CAPS Take by mouth.    . NON FORMULARY KRATOM as needed for pain    . OVER THE COUNTER MEDICATION ALPHA LIPOIC ACID.    Marland Kitchen. OVER THE COUNTER MEDICATION BC ARTHRITIS     No current facility-administered medications on file prior to visit.     BP 105/70   Pulse 61   Temp (!) 95 F (35 C) (Oral)   Resp 18   Ht 5\' 5"  (1.651 m)   Wt 180 lb (81.6 kg)   LMP 07/18/2011   SpO2 99%   BMI 29.95 kg/m    Objective:   Physical Exam  Constitutional: She appears well-developed and well-nourished.  Cardiovascular: Normal rate, regular rhythm and normal heart sounds.   No murmur heard. Pulmonary/Chest: Effort normal and breath sounds normal. No respiratory distress. She has no wheezes.   Musculoskeletal:  + tenderness at multiple tenderpoints (10 +)  Psychiatric: She has a normal mood and affect. Her behavior is normal. Judgment and thought content normal.          Assessment & Plan:  Depression- stable on lexapro. She may have some added benefit from addition of cymbalta for FM.

## 2017-04-01 ENCOUNTER — Ambulatory Visit (INDEPENDENT_AMBULATORY_CARE_PROVIDER_SITE_OTHER): Payer: BLUE CROSS/BLUE SHIELD | Admitting: Family

## 2017-04-01 ENCOUNTER — Encounter: Payer: Self-pay | Admitting: Family

## 2017-04-01 VITALS — BP 117/78 | HR 75 | Temp 98.1°F | Resp 18 | Ht 65.0 in | Wt 182.0 lb

## 2017-04-01 DIAGNOSIS — F32A Depression, unspecified: Secondary | ICD-10-CM

## 2017-04-01 DIAGNOSIS — M797 Fibromyalgia: Secondary | ICD-10-CM

## 2017-04-01 DIAGNOSIS — F329 Major depressive disorder, single episode, unspecified: Secondary | ICD-10-CM

## 2017-04-01 DIAGNOSIS — N951 Menopausal and female climacteric states: Secondary | ICD-10-CM

## 2017-04-01 MED ORDER — ESCITALOPRAM OXALATE 20 MG PO TABS: ORAL_TABLET | ORAL | 2 refills | Status: DC

## 2017-04-01 MED ORDER — DULOXETINE HCL 30 MG PO CPEP: 30.0000 mg | ORAL_CAPSULE | Freq: Every day | ORAL | 5 refills | Status: DC

## 2017-04-01 MED ORDER — MELOXICAM 7.5 MG PO TABS: 7.5000 mg | ORAL_TABLET | Freq: Every day | ORAL | 2 refills | Status: DC

## 2017-04-01 NOTE — Progress Notes (Signed)
Subjective:    Patient ID: Haley Carlson, female    DOB: 1965-12-23, 51 y.o.   MRN: [id number]  HPI   Pt is a 51 yr old female who presents today for follow up.  Depression- maintained on lexapro. Reports mood is good.   Fibromyalgia- we added low dose cymbalta last visit for FM pain. Reports that she feels like it helped her fibro pain considerably until it got colder.  Then felt like her pain worsened. Notes that she always feels fatigued.   Uses 6 advil a day.  Uses icy hot, wears a warming pad across her shoulders/neck which helps.    Review of Systems See HPI  Past Medical History:  Diagnosis Date  . Depression   . Implanon removal    PT HAD IMPLANON INSERTED 12/2009 AND REMOVED 03/17/10  . Osteopenia 10/15/2016   T score -2.4 10/15/16  . Substance abuse (HCC)    cocaine, marijuana, alcohol; sober since 10/2008  . SVT (supraventricular tachycardia) (HCC)      Social History   Social History  . Marital status: Legally Separated    Spouse name: N/A  . Number of children: N/A  . Years of education: N/A   Occupational History  . Not on file.   Social History Main Topics  . Smoking status: Current Every Day Smoker    Packs/day: 0.50    Years: 25.00    Types: Cigarettes  . Smokeless tobacco: Never Used  . 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 Drug use: No     Comment: remote use cocain, methamphetamine, cocaine, marijuana  . Sexual activity: Yes    Partners: Male    Birth control/ protection: Post-menopausal   Other Topics Concern  . 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 AUGMENTATION MAMMAPLASTY  2009   IMPLANTS  . ENDOMETRIAL ABLATION  11/2007   HER OPTION ABLATION  . EXTERNAL EAR  SURGERY  1993   CYST REMOVED    Family History  Problem Relation Age of Onset  . Hypertension Mother   . Cancer Mother        breast cancer 2014  . Thyroid disease Mother        Hashimoto's  . Dementia Father     Allergies  Allergen Reactions  . Cymbalta [Duloxetine Hcl] Other (See Comments)    Pt states it makes her "feel crazy"     Current Outpatient Prescriptions on File Prior to Visit  Medication Sig Dispense Refill  . BLACK COHOSH PO Take 1 tablet by mouth daily.    . Calcium Carbonate-Vitamin D (CALTRATE 600+D) 600-400 MG-UNIT tablet Take 1 tablet by mouth 2 (two) times daily.    . DULoxetine (CYMBALTA) 30 MG capsule Take 1 capsule (30 mg total) by mouth daily. 30 capsule 2  . escitalopram (LEXAPRO) 20 MG tablet Take 1 daily 30 tablet 5  . ibuprofen (ADVIL,MOTRIN) 200 MG tablet Take 400 mg by mouth 4 (four) times daily.    Marland Kitchen OVER THE COUNTER MEDICATION BC ARTHRITIS     No current facility-administered medications on file prior to visit.  BP 117/78 (BP Location: Right Arm, Cuff Size: Large)   Pulse 75   Temp 98.1 F (36.7 C) (Oral)   Resp 18   Ht 5\' 5"  (1.651 m)   Wt 182 lb (82.6 kg)   LMP 07/18/2011   SpO2 99%   BMI 30.29 kg/m       Objective:   Physical Exam  Constitutional: She is oriented to person, place, and time. She appears well-developed and well-nourished.  Cardiovascular: Normal rate, regular rhythm and normal heart sounds.   No murmur heard. Pulmonary/Chest: Effort normal and breath sounds normal. No respiratory distress. She has no wheezes.  Musculoskeletal: She exhibits no edema.  Neurological: She is alert and oriented to person, place, and time.  Psychiatric: She has a normal mood and affect. Her behavior is normal. Judgment and thought content normal.          Assessment & Plan:  Declines lab work today to evaluate fatigue. Wishes to wait until her cpx in April.  Declines flu shot.   Depression- stable on lexapro- continue  same.  Fibromyalgia- improved some with addition of cymbalta.  Pt is advised as follows:  Stop ibuprofen, begin meloxicam once daily.  Add tylenol 1000mg  twice daily. Continue lexapro and cymbalta.

## 2017-04-01 NOTE — Patient Instructions (Signed)
Stop ibuprofen, begin meloxicam once daily.  Add tylenol 1000mg  twice daily. Continue lexapro and cymbalta.

## 2017-10-07 ENCOUNTER — Encounter: Payer: Self-pay | Admitting: Family

## 2017-10-07 ENCOUNTER — Ambulatory Visit (INDEPENDENT_AMBULATORY_CARE_PROVIDER_SITE_OTHER): Payer: BLUE CROSS/BLUE SHIELD | Admitting: Family

## 2017-10-07 VITALS — BP 104/76 | HR 65 | Temp 98.3°F | Resp 16 | Ht 64.5 in | Wt 176.2 lb

## 2017-10-07 DIAGNOSIS — Z0000 Encounter for general adult medical examination without abnormal findings: Secondary | ICD-10-CM | POA: Diagnosis not present

## 2017-10-07 DIAGNOSIS — F329 Major depressive disorder, single episode, unspecified: Secondary | ICD-10-CM

## 2017-10-07 DIAGNOSIS — R202 Paresthesia of skin: Secondary | ICD-10-CM | POA: Diagnosis not present

## 2017-10-07 DIAGNOSIS — M797 Fibromyalgia: Secondary | ICD-10-CM

## 2017-10-07 DIAGNOSIS — F32A Depression, unspecified: Secondary | ICD-10-CM

## 2017-10-07 LAB — TSH: TSH: 2.86 mIU/L

## 2017-10-07 MED ORDER — GABAPENTIN 100 MG PO CAPS: 100.0000 mg | ORAL_CAPSULE | Freq: Three times a day (TID) | ORAL | 3 refills | Status: DC

## 2017-10-07 NOTE — Progress Notes (Signed)
Subjective:    Patient ID: Haley Carlson, female    DOB: 03-26-1966, 52 y.o.   MRN: [id number]  HPI  Ms. Haley Carlson is a 52 yr old female who presents today for cpx.   Patient presents today for complete physical.  Immunizations: td 2018 Diet:  healthy Exercise:  Minimal due to pain Colonoscopy: due Pap Smear:   09/27/16 Mammogram:  10/12/16 Vision: up to date Dental:  due  FM- last visit we recommended d/c ibuprofen and begin meloxicam.  She did find much improvement in her symptoms.  She reports that she uses icy hot/salon pas Jan/February. Doing better now that the weather is warmer.  Has shooting pain in her feet. Intermittent.    Depression- reports that her mood is good when her pain is controlled.  Depression symptoms worse when she is hurting.     Review of Systems     Past Medical History:  Diagnosis Date  . Depression   . Implanon removal    PT HAD IMPLANON INSERTED 12/2009 AND REMOVED 03/17/10  . Osteopenia 10/15/2016   T score -2.4 10/15/16  . Substance abuse (HCC)    cocaine, marijuana, alcohol; sober since 10/2008  . SVT (supraventricular tachycardia) (HCC)      Social History   Socioeconomic History  . Marital status: Legally Separated    Spouse name: Not on file  . Number of children: Not on file  . Years of education: Not on file  . Highest education level: Not on file  Occupational History  . Not on file  Social Needs  . Financial resource strain: Not on file  . Food insecurity:    Worry: Not on file    Inability: Not on file  . Transportation needs:    Medical: Not on file    Non-medical: Not on file  Tobacco Use  . Smoking status: Current Every Day Smoker    Packs/day: 0.50    Years: 25.00    Pack years: 12.50    Types: Cigarettes  . Smokeless tobacco: Never Used  Substance and Sexual Activity  . 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 Drug use: No    Comment: remote use cocain,  methamphetamine, cocaine, marijuana  . Sexual activity: Yes    Partners: Male    Birth control/protection: Post-menopausal  Lifestyle  . Physical activity:    Days per week: Not on file    Minutes per session: Not on file  . Stress: Not on file  Relationships  . Social connections:    Talks on phone: Not on file    Gets together: Not on file    Attends religious service: Not on file    Active member of club or organization: Not on file    Attends meetings of clubs or organizations: Not on file    Relationship status: Not on file  . Intimate partner violence:    Fear of current or ex partner: Not on file    Emotionally abused: Not on file    Physically abused: Not on file    Forced sexual activity: Not on file  Other Topics Concern  . 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 AUGMENTATION MAMMAPLASTY  2009   IMPLANTS  . ENDOMETRIAL ABLATION  11/2007   HER OPTION ABLATION  . EXTERNAL EAR SURGERY  1993   CYST REMOVED    Family History  Problem Relation Age of Onset  . Hypertension Mother   . Cancer Mother        breast cancer 2014  . Thyroid disease Mother        Hashimoto's  . Dementia Father     No Active Allergies  Current Outpatient Medications on File Prior to Visit  Medication Sig Dispense Refill  . BLACK COHOSH PO Take 1 tablet by mouth daily.    . DULoxetine (CYMBALTA) 30 MG capsule Take 1 capsule (30 mg total) by mouth daily. 30 capsule 5  . escitalopram (LEXAPRO) 20 MG tablet Take 1 daily 30 tablet 2  . magnesium gluconate (MAGONATE) 500 MG tablet Take 500 mg by mouth daily.    . meloxicam (MOBIC) 7.5 MG tablet Take 1 tablet (7.5 mg total) by mouth daily. 30 tablet 2  . OVER THE COUNTER MEDICATION BC ARTHRITIS     . OVER THE COUNTER MEDICATION CBD capsules. Take 1 capsule daily as needed.     No current facility-administered medications on file prior to visit.     Pulse 65   Temp 98.3 F (36.8 C) (Oral)   Resp 16   Ht 5' 4.5" (1.638 m)   Wt 176 lb 3.2 oz (79.9 kg)   LMP 07/18/2011   SpO2 100%   BMI 29.78 kg/m    Objective:   Physical Exam  Physical Exam  Constitutional: She is oriented to person, place, and time. She appears well-developed and well-nourished. No distress.  HENT:  Head: Normocephalic and atraumatic.  Right Ear: Tympanic membrane and ear canal normal.  Left Ear: Tympanic membrane and ear canal normal.  Mouth/Throat: Oropharynx is clear and moist.  Eyes: Pupils are equal, round, and reactive to light. No scleral icterus.  Neck: Normal range of motion. No thyromegaly present.  Cardiovascular: Normal rate and regular rhythm.   No murmur heard. Pulmonary/Chest: Effort normal and breath sounds normal. No respiratory distress. He has no wheezes. She has no rales. She exhibits no tenderness.  Abdominal: Soft. Bowel sounds are normal. She exhibits no distension and no mass. There is no tenderness. There is no rebound and no guarding.  Musculoskeletal: She exhibits no edema.  Lymphadenopathy:    She has no cervical adenopathy.  Neurological: She is alert and oriented to person, place, and time. She has normal patellar reflexes. She exhibits normal muscle tone. Coordination normal.  Skin: Skin is warm and dry.  Psychiatric: She has a normal mood and affect. Her behavior is normal. Judgment and thought content normal.  Breasts: Examined lying Right: Without masses, retractions, discharge or axillary adenopathy.  Left: Without masses, retractions, discharge or axillary adenopathy.  Pelvic: deferrred           Assessment & Plan:  Preventative care- discussed healthy diet, exercise, weight loss.  Obtain routine lab work.  FM- advised pt to minimize use of ibuprofen, can  use tylenol as needed. Continue cymbalta.  Depression-  Scored 15 on PHQ-9.  She attributes to pain.  Continue current dose of Lexapro.  Paresthesias-has intermittent shooting pain in feet.  Could be related to fibro-.  Will give her a trial of gabapentin.  Obtain B12 and folate.  Assessment & Plan:

## 2017-10-07 NOTE — Patient Instructions (Signed)
Please schedule a routine dental exam.  Complete lab work prior to leaving.

## 2017-10-08 LAB — CBC WITH DIFFERENTIAL/PLATELET
BASOS PCT: 0.6 %
Basophils Absolute: 53 cells/uL (ref 0–200)
Eosinophils Absolute: 134 cells/uL (ref 15–500)
Eosinophils Relative: 1.5 %
HEMATOCRIT: 33.8 % — AB (ref 35.0–45.0)
Hemoglobin: 11.4 g/dL — ABNORMAL LOW (ref 11.7–15.5)
LYMPHS ABS: 2866 {cells}/uL (ref 850–3900)
MCH: 29.4 pg (ref 27.0–33.0)
MCHC: 33.7 g/dL (ref 32.0–36.0)
MCV: 87.1 fL (ref 80.0–100.0)
MPV: 10.5 fL (ref 7.5–12.5)
Monocytes Relative: 9.7 %
Neutro Abs: 4984 cells/uL (ref 1500–7800)
Neutrophils Relative %: 56 %
Platelets: 290 10*3/uL (ref 140–400)
RBC: 3.88 10*6/uL (ref 3.80–5.10)
RDW: 13.3 % (ref 11.0–15.0)
Total Lymphocyte: 32.2 %
WBC: 8.9 10*3/uL (ref 3.8–10.8)
WBCMIX: 863 {cells}/uL (ref 200–950)

## 2017-10-08 LAB — BASIC METABOLIC PANEL
BUN: 19 mg/dL (ref 7–25)
CO2: 24 mmol/L (ref 20–32)
CREATININE: 0.73 mg/dL (ref 0.50–1.05)
Calcium: 9.2 mg/dL (ref 8.6–10.4)
Chloride: 105 mmol/L (ref 98–110)
Glucose, Bld: 73 mg/dL (ref 65–99)
POTASSIUM: 4.1 mmol/L (ref 3.5–5.3)
Sodium: 142 mmol/L (ref 135–146)

## 2017-10-08 LAB — HEPATIC FUNCTION PANEL
AG RATIO: 2.2 (calc) (ref 1.0–2.5)
ALT: 11 U/L (ref 6–29)
AST: 14 U/L (ref 10–35)
Albumin: 4.3 g/dL (ref 3.6–5.1)
Alkaline phosphatase (APISO): 76 U/L (ref 33–130)
BILIRUBIN INDIRECT: 0.2 mg/dL (ref 0.2–1.2)
Bilirubin, Direct: 0 mg/dL (ref 0.0–0.2)
Globulin: 2 g/dL (calc) (ref 1.9–3.7)
TOTAL PROTEIN: 6.3 g/dL (ref 6.1–8.1)
Total Bilirubin: 0.2 mg/dL (ref 0.2–1.2)

## 2017-10-08 LAB — URINALYSIS, ROUTINE W REFLEX MICROSCOPIC
BILIRUBIN URINE: NEGATIVE
Glucose, UA: NEGATIVE
HGB URINE DIPSTICK: NEGATIVE
KETONES UR: NEGATIVE
Leukocytes, UA: NEGATIVE
NITRITE: NEGATIVE
PH: 6 (ref 5.0–8.0)
Protein, ur: NEGATIVE
Specific Gravity, Urine: 1.014 (ref 1.001–1.03)

## 2017-10-08 LAB — B12 AND FOLATE PANEL
FOLATE: 10.1 ng/mL
VITAMIN B 12: 615 pg/mL (ref 200–1100)

## 2017-10-08 LAB — LIPID PANEL
Cholesterol: 223 mg/dL — ABNORMAL HIGH (ref <200)
HDL: 65 mg/dL (ref >50)
LDL CHOLESTEROL (CALC): 136 mg/dL — AB
NON-HDL CHOLESTEROL (CALC): 158 mg/dL — AB (ref <130)
TRIGLYCERIDES: 114 mg/dL (ref <150)
Total CHOL/HDL Ratio: 3.4 (calc) (ref <5.0)

## 2017-10-11 LAB — IRON: Iron: 33 ug/dL — ABNORMAL LOW (ref 45–160)

## 2017-10-11 LAB — TEST AUTHORIZATION

## 2017-10-11 LAB — FERRITIN: Ferritin: 20 ng/mL (ref 10–232)

## 2017-10-12 ENCOUNTER — Other Ambulatory Visit: Payer: Self-pay | Admitting: Family

## 2017-10-12 ENCOUNTER — Telehealth: Payer: Self-pay | Admitting: Family

## 2017-10-12 DIAGNOSIS — E559 Vitamin D deficiency, unspecified: Secondary | ICD-10-CM

## 2017-10-12 DIAGNOSIS — D509 Iron deficiency anemia, unspecified: Secondary | ICD-10-CM | POA: Insufficient documentation

## 2017-10-12 LAB — VITAMIN D 25 HYDROXY (VIT D DEFICIENCY, FRACTURES): Vit D, 25-Hydroxy: 18 ng/mL — ABNORMAL LOW (ref 30–100)

## 2017-10-12 MED ORDER — VITAMIN D (ERGOCALCIFEROL) 1.25 MG (50000 UNIT) PO CAPS: 50000.0000 [IU] | ORAL_CAPSULE | ORAL | 0 refills | Status: DC

## 2017-10-12 NOTE — Telephone Encounter (Signed)
Vit d is low. I would recommend that she start weekly vit D x 12 weeks then repeat vit D level.   Also, she is anemic and iron is low. Add iron supplement  bid OTC. Repeat cbc, serum iron in 3 months, dx iron def anemia. Also, I would like her to complete an IFOB test.

## 2017-10-12 NOTE — Telephone Encounter (Signed)
Lm for pt. To call back about results, ok for triage nuse to release information and advise patient about the vit d rx that was sent in for her.

## 2017-10-17 NOTE — Telephone Encounter (Signed)
Left message for pt to return my call.

## 2017-10-18 ENCOUNTER — Encounter (HOSPITAL_BASED_OUTPATIENT_CLINIC_OR_DEPARTMENT_OTHER): Payer: Self-pay

## 2017-10-18 ENCOUNTER — Ambulatory Visit (HOSPITAL_BASED_OUTPATIENT_CLINIC_OR_DEPARTMENT_OTHER)
Admission: RE | Admit: 2017-10-18 | Discharge: 2017-10-18 | Disposition: A | Discharge status: Home / Self Care | Payer: BLUE CROSS/BLUE SHIELD | Source: Ambulatory Visit | Attending: Family | Admitting: Family

## 2017-10-18 ENCOUNTER — Other Ambulatory Visit: Payer: Self-pay | Admitting: Family

## 2017-10-18 DIAGNOSIS — Z1231 Encounter for screening mammogram for malignant neoplasm of breast: Secondary | ICD-10-CM | POA: Insufficient documentation

## 2017-10-18 DIAGNOSIS — Z0000 Encounter for general adult medical examination without abnormal findings: Secondary | ICD-10-CM

## 2017-10-18 NOTE — Telephone Encounter (Signed)
Pt calling back for results

## 2017-10-18 NOTE — Telephone Encounter (Signed)
Patient called, left VM to return call to the office for lab results. 

## 2017-10-18 NOTE — Telephone Encounter (Signed)
Pt given lab results and recommendations per notes of Melissa,NP on 10/12/17. Pt verbalized understanding.  Pt also asking if prescription for Vit D could be resent. Pt states she think she threw away prescription while cleaning up. Pt would like for prescription to be sent to [facility]. Pt already had follow up scheduled for 8/2 at 3pm with Melissa and recheck of Cbc,iron lab appt scheduled for 8/2 at 2:45pm.   Pt also needs to know if she needs to come in the office to have IFOB test done.

## 2017-10-28 MED ORDER — VITAMIN D (ERGOCALCIFEROL) 1.25 MG (50000 UNIT) PO CAPS: 50000.0000 [IU] | ORAL_CAPSULE | ORAL | 0 refills | Status: DC

## 2017-10-28 NOTE — Addendum Note (Signed)
Addended by: Mervin Kung A on: 10/28/2017 02:03 PM   Modules accepted: Orders

## 2017-10-28 NOTE — Telephone Encounter (Signed)
Left detailed message on pt's voicemail that she will need to contact her insurance first regarding the lost Vitamin D RX as they will say it is too soon to fill it if we send another Rx. Also advised pt that I will mail the IFOB kit to her home address and to call us if she has additional questions. IFOB kit mailed.

## 2017-11-09 ENCOUNTER — Other Ambulatory Visit: Payer: Self-pay | Admitting: Family

## 2017-12-09 ENCOUNTER — Encounter: Payer: Self-pay | Admitting: Family

## 2017-12-12 ENCOUNTER — Other Ambulatory Visit: Payer: Self-pay | Admitting: Family

## 2017-12-12 DIAGNOSIS — N951 Menopausal and female climacteric states: Secondary | ICD-10-CM

## 2018-01-13 ENCOUNTER — Other Ambulatory Visit: Payer: Self-pay

## 2018-01-13 ENCOUNTER — Ambulatory Visit: Payer: Self-pay | Admitting: Family

## 2018-01-18 ENCOUNTER — Other Ambulatory Visit: Payer: BLUE CROSS/BLUE SHIELD

## 2018-01-18 ENCOUNTER — Encounter: Payer: Self-pay | Admitting: Family

## 2018-01-18 ENCOUNTER — Telehealth: Payer: Self-pay | Admitting: Family

## 2018-01-18 ENCOUNTER — Ambulatory Visit (INDEPENDENT_AMBULATORY_CARE_PROVIDER_SITE_OTHER): Payer: BLUE CROSS/BLUE SHIELD | Admitting: Family

## 2018-01-18 VITALS — BP 97/63 | HR 72 | Temp 98.3°F | Resp 16 | Ht 65.0 in | Wt 174.0 lb

## 2018-01-18 DIAGNOSIS — M797 Fibromyalgia: Secondary | ICD-10-CM | POA: Diagnosis not present

## 2018-01-18 DIAGNOSIS — F329 Major depressive disorder, single episode, unspecified: Secondary | ICD-10-CM

## 2018-01-18 DIAGNOSIS — R51 Headache: Secondary | ICD-10-CM | POA: Diagnosis not present

## 2018-01-18 DIAGNOSIS — N951 Menopausal and female climacteric states: Secondary | ICD-10-CM

## 2018-01-18 DIAGNOSIS — E611 Iron deficiency: Secondary | ICD-10-CM | POA: Diagnosis not present

## 2018-01-18 DIAGNOSIS — R519 Headache, unspecified: Secondary | ICD-10-CM

## 2018-01-18 DIAGNOSIS — F32A Depression, unspecified: Secondary | ICD-10-CM

## 2018-01-18 LAB — CBC WITH DIFFERENTIAL/PLATELET
Basophils Absolute: 0 10*3/uL (ref 0.0–0.1)
Basophils Relative: 0.6 % (ref 0.0–3.0)
EOS ABS: 0.1 10*3/uL (ref 0.0–0.7)
Eosinophils Relative: 1.2 % (ref 0.0–5.0)
HCT: 37.9 % (ref 36.0–46.0)
HEMOGLOBIN: 12.8 g/dL (ref 12.0–15.0)
LYMPHS ABS: 1.8 10*3/uL (ref 0.7–4.0)
Lymphocytes Relative: 25.9 % (ref 12.0–46.0)
MCHC: 33.7 g/dL (ref 30.0–36.0)
MCV: 88.9 fl (ref 78.0–100.0)
MONO ABS: 0.5 10*3/uL (ref 0.1–1.0)
Monocytes Relative: 7.6 % (ref 3.0–12.0)
NEUTROS PCT: 64.7 % (ref 43.0–77.0)
Neutro Abs: 4.6 10*3/uL (ref 1.4–7.7)
Platelets: 288 10*3/uL (ref 150.0–400.0)
RBC: 4.26 Mil/uL (ref 3.87–5.11)
RDW: 14.9 % (ref 11.5–15.5)
WBC: 7.1 10*3/uL (ref 4.0–10.5)

## 2018-01-18 LAB — FERRITIN: FERRITIN: 20.9 ng/mL (ref 10.0–291.0)

## 2018-01-18 LAB — IRON: IRON: 54 ug/dL (ref 42–145)

## 2018-01-18 MED ORDER — ESCITALOPRAM OXALATE 20 MG PO TABS: 10.0000 mg | ORAL_TABLET | Freq: Every day | ORAL | 1 refills | Status: AC

## 2018-01-18 MED ORDER — DULOXETINE HCL 60 MG PO CPEP: 60.0000 mg | ORAL_CAPSULE | Freq: Every day | ORAL | 3 refills | Status: DC

## 2018-01-18 NOTE — Progress Notes (Signed)
Subjective:    Patient ID: Haley Carlson, female    DOB: [birth year], 52 y.o.   MRN: [id number]  HPI  Patient is a 52 yr old female who presents today for follow up.  Depression-last visit depression was uncontrolled and she attributed this to her pain. Reports that she went a 1/2 day to work last Monday. Reports that she has been having headaches.  Sunday had bad fibro pain.  Reports that 7/23 week she was off from work but felt terrible, zero energy, did not shower, "I could not bring myself to do anything."    Feels "like all my muscles are very tight all the time."  Trouble focusing.  Fatigue is severe.   Reports her home life is good.  Denies SI.  She reports aching in her shoulders, head aches, feet hurt more, "like more neuropathy in my feet.    Paresthesias- was given a trial of gabapentin last visit.      Review of Systems   See HPI  Past Medical History:  Diagnosis Date  . Depression   . Implanon removal    PT HAD IMPLANON INSERTED 12/2009 AND REMOVED 03/17/10  . Osteopenia 10/15/2016   T score -2.4 10/15/16  . Substance abuse (HCC)    cocaine, marijuana, alcohol; sober since 10/2008  . SVT (supraventricular tachycardia) (HCC)      Social History   Socioeconomic History  . Marital status: Legally Separated    Spouse name: Not on file  . Number of children: Not on file  . Years of education: Not on file  . Highest education level: Not on file  Occupational History  . Not on file  Social Needs  . Financial resource strain: Not on file  . Food insecurity:    Worry: Not on file    Inability: Not on file  . Transportation needs:    Medical: Not on file    Non-medical: Not on file  Tobacco Use  . Smoking status: Current Every Day Smoker    Packs/day: 0.50    Years: 25.00    Pack years: 12.50    Types: Cigarettes  . Smokeless tobacco: Never Used  Substance and Sexual Activity  . Alcohol use: No    Alcohol/week: 0.0 oz    Comment: recovering alcoholic-  2010 rehab  . Drug use: No    Comment: remote use cocain, methamphetamine, cocaine, marijuana  . Sexual activity: Yes    Partners: Male    Birth control/protection: Post-menopausal  Lifestyle  . Physical activity:    Days per week: Not on file    Minutes per session: Not on file  . Stress: Not on file  Relationships  . Social connections:    Talks on phone: Not on file    Gets together: Not on file    Attends religious service: Not on file    Active member of club or organization: Not on file    Attends meetings of clubs or organizations: Not on file    Relationship status: Not on file  . Intimate partner violence:    Fear of current or ex partner: Not on file    Emotionally abused: Not on file    Physically abused: Not on file    Forced sexual activity: Not on file  Other Topics Concern  . 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 AUGMENTATION MAMMAPLASTY  2009   IMPLANTS  . ENDOMETRIAL ABLATION  11/2007   HER OPTION ABLATION  . EXTERNAL EAR SURGERY  1993   CYST REMOVED    Family History  Problem Relation Age of Onset  . Hypertension Mother   . Cancer Mother        breast cancer 2014  . Thyroid disease Mother        Hashimoto's  . Dementia Father     No Active Allergies  Current Outpatient Medications on File Prior to Visit  Medication Sig Dispense Refill  . ferrous sulfate 325 (65 FE) MG tablet Take 325 mg by mouth 2 (two) times daily with a meal.    . gabapentin (NEURONTIN) 100 MG capsule Take 1 capsule (100 mg total) by mouth 3 (three) times daily. 90 capsule 3  . magnesium gluconate (MAGONATE) 500 MG tablet Take 500 mg by mouth daily.    . meloxicam (MOBIC) 7.5 MG tablet TAKE 1 TABLET BY MOUTH ONCE DAILY 30 tablet  2  . OVER THE COUNTER MEDICATION CBD capsules. Take 1 capsule daily as needed.    . Vitamin D, Ergocalciferol, (DRISDOL) 50000 units CAPS capsule Take 1 capsule (50,000 Units total) by mouth every 7 (seven) days. 12 capsule 0  . OVER THE COUNTER MEDICATION BC ARTHRITIS     No current facility-administered medications on file prior to visit.     BP 97/63 (BP Location: Right Arm, Cuff Size: Normal)   Pulse 72   Temp 98.3 F (36.8 C) (Oral)   Resp 16   Ht 5\' 5"  (1.651 m)   Wt 174 lb (78.9 kg)   LMP 07/18/2011   SpO2 100%   BMI 28.96 kg/m        Objective:   Physical Exam  Constitutional: She is oriented to person, place, and time. She appears well-developed and well-nourished.  Cardiovascular: Normal rate, regular rhythm and normal heart sounds.  No murmur heard. Pulmonary/Chest: Effort normal and breath sounds normal. No respiratory distress. She has no wheezes.  Musculoskeletal: She exhibits no edema.  Neurological: She is alert and oriented to person, place, and time.  Skin: Skin is warm and dry.  Psychiatric: Her behavior is normal. Judgment and thought content normal.  Flat affect          Assessment & Plan:  Depression-this is uncontrolled.  Likely exacerbated by her pain.  We will plan to increase her Cymbalta from 30 to 60 mg once daily for both depression and fibromyalgia pain.  Since we are increasing her SNRI I would like to decrease her dose of Lexapro to decrease her risk of serotonin syndrome.  We will decrease Lexapro from 20 to 10 mg once daily.  She is advised to let me know if she has any worsening of her depression with this transition.  She is advised to call 911 if she develops lot of hurting herself or others.  She currently denies suicidal ideation.  Fibromyalgia-this is uncontrolled.  She continues gabapentin.  Hopefully with increase in Cymbalta pain will also improve.  Headaches- will refer to neurology for further evaluation.

## 2018-01-18 NOTE — Patient Instructions (Signed)
Please complete lab work prior to leaving. You will be contacted about your referral to Neurology. Decrease lexapro from 86m to 153m Increase cymbalta from 308mo 56m57mComplete stool kit and mail back at your earliest convenience.

## 2018-01-18 NOTE — Telephone Encounter (Signed)
Notified pharmacist that directions should be for Lexapro 20mg , Take 1/2 tablet by mouth daily.

## 2018-01-18 NOTE — Telephone Encounter (Signed)
Copied from CRM #[id number]. Topic: Quick Communication - Rx Refill/Question >> Jan 18, 2018 11:34 AM Zada GirtLander, Haley Carlson wrote: Medication: [phone] (LEXAPRO) 20 MG tablet (call back to clarify dosage for pharmacy)  Has the patient contacted their pharmacy? Yes.   (Agent: If no, request that the patient contact the pharmacy for the refill.) (Agent: If yes, when and what did the pharmacy advise?)  Preferred Pharmacy (with phone number or street name): [facility] [address] - [place], [place] - [id number] [place] [place] [id number] Phone: [phone] Fax: [phone]    Agent: Please be advised that RX refills may take up to 3 business days. We ask that you follow-up with your pharmacy.

## 2018-01-20 ENCOUNTER — Ambulatory Visit (INDEPENDENT_AMBULATORY_CARE_PROVIDER_SITE_OTHER): Payer: BLUE CROSS/BLUE SHIELD | Admitting: Neurology

## 2018-01-20 ENCOUNTER — Encounter: Payer: Self-pay | Admitting: Neurology

## 2018-01-20 VITALS — BP 90/70 | HR 61 | Ht 65.0 in | Wt 172.0 lb

## 2018-01-20 DIAGNOSIS — M5481 Occipital neuralgia: Secondary | ICD-10-CM

## 2018-01-20 NOTE — Progress Notes (Addendum)
NEUROLOGY CONSULTATION NOTE  Alta Goding MRN: [id number] DOB: May 01, 1966  Referring provider: Sandford Craze, NP Primary care provider: Sandford Craze, NP  Reason for consult:  headache  HISTORY OF PRESENT ILLNESS: Haley Carlson is a 52 year old female with depression, fibormyalgia, SVT and history of substance abuse who presents for headache.  History supplemented by referring provider's note  Onset:  2 months ago Location:  Bilateral occipital region radiating up top of head and to the front Quality:  Stabbing/electric Intensity:  severe.  She denies thunderclap headache. Aura:  no Prodrome:  no Postdrome:  no Associated symptoms:  Paresthesias on back of scalp.  No nausea, vomiting, photophobia, phonophobia, osmophobia, autonomic symptoms, or visual disturbance.  She denies associated unilateral numbness or weakness. Duration:  Few seconds Frequency:  4 times a week Frequency of abortive medication: No medication Triggers:  Bending down to pick something off the floor Exacerbating factors:  No Relieving factors:  Self-resolves Activity:  no  Current NSAIDS:  Mobic 7.5mg  Current analgesics:  no Current triptans:  no Current ergotamine:  no Current anti-emetic:  no Current muscle relaxants:  no Current anti-anxiolytic:  no Current sleep aide:  no Current Antihypertensive medications:  no Current Antidepressant medications:  Lexapro 10mg , Cymbalta 60mg  Current Anticonvulsant medications:  Gabapentin 100mg  three times daily Current anti-CGRP:  no Current Vitamins/Herbal/Supplements:  Magnesium 500mg , D Current Antihistamines/Decongestants:  no Other therapy:  no  Past NSAIDS:  Ibuprofen, ASA 325mg  Past analgesics:  Acetaminophen, hydrocodone, tramadol Past abortive triptans:  no Past abortive ergotamine:  no Past muscle relaxants:  Cyclobenzaprine 10mg , Robaxin Past anti-emetic:  no Past antihypertensive medications:  no Past antidepressant  medications:  no Past anticonvulsant medications:  no Past anti-CGRP:  no Past vitamins/Herbal/Supplements:  no Past antihistamines/decongestants:  Flonase, Allegra Other past therapies:  No  She does have neck pain and occasionally has radiating pain and numbness down the arms.  Cervical X-ray from 06/29/16 personally reviewed and showed mild disc narrowing with endplate spurs and C5-6 and C6-7.  CT of head from 04/06/08 to assess headache was personally reviewed and was unremarkable. BMP Latest Ref Rng & Units 10/07/2017 09/27/2016 09/20/2013  Glucose 65 - 99 mg/dL 73 89 76  BUN 7 - 25 mg/dL 19 16 52(W)  Creatinine 0.50 - 1.05 mg/dL 4.13 2.44 0.10  BUN/Creat Ratio 6 - 22 (calc) NOT APPLICABLE - -  Sodium 135 - 146 mmol/L 142 138 141  Potassium 3.5 - 5.3 mmol/L 4.1 4.2 4.5  Chloride 98 - 110 mmol/L 105 105 102  CO2 20 - 32 mmol/L 24 28 28   Calcium 8.6 - 10.4 mg/dL 9.2 9.4 9.3      PAST MEDICAL HISTORY: Past Medical History:  Diagnosis Date  . Depression   . Implanon removal    PT HAD IMPLANON INSERTED 12/2009 AND REMOVED 03/17/10  . Osteopenia 10/15/2016   T score -2.4 10/15/16  . Substance abuse (HCC)    cocaine, marijuana, alcohol; sober since 10/2008  . SVT (supraventricular tachycardia) (HCC)     PAST SURGICAL HISTORY: Past Surgical History:  Procedure Laterality Date  . AUGMENTATION MAMMAPLASTY  2009   IMPLANTS  . ENDOMETRIAL ABLATION  11/2007   HER OPTION ABLATION  . EXTERNAL EAR SURGERY  1993   CYST REMOVED    MEDICATIONS: Current Outpatient Medications on File Prior to Visit  Medication Sig Dispense Refill  . DULoxetine (CYMBALTA) 60 MG capsule Take 1 capsule (60 mg total) by mouth daily. 30 capsule 3  . escitalopram (  LEXAPRO) 20 MG tablet Take 0.5 tablets (10 mg total) by mouth daily. TAKE 1 TABLET BY MOUTH ONCE DAILY (Patient taking differently: Take 10 mg by mouth daily. ) 45 tablet 1  . ferrous sulfate 325 (65 FE) MG tablet Take 325 mg by mouth 2 (two) times  daily with a meal.    . gabapentin (NEURONTIN) 100 MG capsule Take 1 capsule (100 mg total) by mouth 3 (three) times daily. 90 capsule 3  . magnesium gluconate (MAGONATE) 500 MG tablet Take 500 mg by mouth daily.    . meloxicam (MOBIC) 7.5 MG tablet TAKE 1 TABLET BY MOUTH ONCE DAILY 30 tablet 2  . OVER THE COUNTER MEDICATION BC ARTHRITIS    . OVER THE COUNTER MEDICATION CBD capsules. Take 1 capsule daily as needed.    . Vitamin D, Ergocalciferol, (DRISDOL) 50000 units CAPS capsule Take 1 capsule (50,000 Units total) by mouth every 7 (seven) days. 12 capsule 0   No current facility-administered medications on file prior to visit.     ALLERGIES: No Active Allergies  FAMILY HISTORY: Family History  Problem Relation Age of Onset  . Hypertension Mother   . Cancer Mother        breast cancer 2014  . Thyroid disease Mother        Hashimoto's  . Dementia Father     SOCIAL HISTORY: Social History   Socioeconomic History  . Marital status: Legally Separated    Spouse name: Not on file  . Number of children: Not on file  . Years of education: Not on file  . Highest education level: Not on file  Occupational History  . Not on file  Social Needs  . Financial resource strain: Not on file  . Food insecurity:    Worry: Not on file    Inability: Not on file  . Transportation needs:    Medical: Not on file    Non-medical: Not on file  Tobacco Use  . Smoking status: Current Every Day Smoker    Packs/day: 0.50    Years: 25.00    Pack years: 12.50    Types: Cigarettes  . Smokeless tobacco: Never Used  Substance and Sexual Activity  . 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 Drug use: No    Comment: remote use cocain, methamphetamine, cocaine, marijuana  . Sexual activity: Yes    Partners: Male    Birth control/protection: Post-menopausal  Lifestyle  . Physical activity:    Days per week: Not on file    Minutes per  session: Not on file  . Stress: Not on file  Relationships  . Social connections:    Talks on phone: Not on file    Gets together: Not on file    Attends religious service: Not on file    Active member of club or organization: Not on file    Attends meetings of clubs or organizations: Not on file    Relationship status: Not on file  . Intimate partner violence:    Fear of current or ex partner: Not on file    Emotionally abused: Not on file    Physically abused: Not on file    Forced sexual activity: Not on file  Other Topics Concern  . Not on file  Social History Narrative   Works at [employer] as a Holiday representative   Married to her ex husband, has a boyfriend of 5 years  3 children   [id number]- son, [facility] [employer]   2001-son local    2004- daughter    One dog ("sancho")    REVIEW OF SYSTEMS: Constitutional: No fevers, chills, or sweats, no generalized fatigue, change in appetite Eyes: No visual changes, double vision, eye pain Ear, nose and throat: No hearing loss, ear pain, nasal congestion, sore throat Cardiovascular: No chest pain, palpitations Respiratory:  No shortness of breath at rest or with exertion, wheezes GastrointestinaI: No nausea, vomiting, diarrhea, abdominal pain, fecal incontinence Genitourinary:  No dysuria, urinary retention or frequency Musculoskeletal:  No neck pain, back pain Integumentary: No rash, pruritus, skin lesions Neurological: as above Psychiatric: No depression, insomnia, anxiety Endocrine: No palpitations, fatigue, diaphoresis, mood swings, change in appetite, change in weight, increased thirst Hematologic/Lymphatic:  No purpura, petechiae. Allergic/Immunologic: no itchy/runny eyes, nasal congestion, recent allergic reactions, rashes  PHYSICAL EXAM: BP 90/70, HR 61, SpO2 97% General: No acute distress.  Patient appears well-groomed.  Head:  Normocephalic/atraumatic Eyes:  fundi examined but not visualized Neck: supple, mild bilateral paraspinal  tenderness, full range of motion Back: No paraspinal tenderness Heart: regular rate and rhythm Lungs: Clear to auscultation bilaterally. Vascular: No carotid bruits. Neurological Exam: Mental status: alert and oriented to person, place, and time, recent and remote memory intact, fund of knowledge intact, attention and concentration intact, speech fluent and not dysarthric, language intact. Cranial nerves: CN I: not tested CN II: pupils equal, round and reactive to light, visual fields intact CN III, IV, VI:  full range of motion, no nystagmus, no ptosis CN V: facial sensation intact CN VII: upper and lower face symmetric CN VIII: hearing intact CN IX, X: gag intact, uvula midline CN XI: sternocleidomastoid and trapezius muscles intact CN XII: tongue midline Bulk & Tone: normal, no fasciculations. Motor:  5/5 throughout  Sensation: temperature and vibration sensation intact. Deep Tendon Reflexes:  2+ throughout, toes downgoing.  Finger to nose testing:  Without dysmetria.  Heel to shin:  Without dysmetria.  Gait:  Normal station and stride.  Romberg negative.  IMPRESSION: Bilateral occipital neuralgia  PLAN: 1.  Gabapentin increased yesterday from 100mg  three times daily to 300mg  three times daily.  Advised to continue dose and contact us in 3 to 4 weeks if not improved and we can increase dose. 2.  Alternative therapy if gabapentin ineffective:  Lyrica, PT of neck.  Occipital nerve blocks recently are no longer covered by most insurances.   3.  Follow up in 3 to 4 months.  Thank you for allowing me to take part in the care of this patient.  Shon MilletAdam Jaziyah Gradel, DO  CC:  Sandford CrazeMelissa O'Sullivan, NP

## 2018-01-20 NOTE — Patient Instructions (Signed)
1.  Remain on the gabapentin 300mg  three times daily.  If not improved in 3 to 4 weeks, contact me and I will increase dose. 2.  Follow up in 3 to 4 months

## 2018-01-23 ENCOUNTER — Telehealth: Payer: Self-pay

## 2018-01-23 ENCOUNTER — Telehealth: Payer: Self-pay | Admitting: Family

## 2018-01-23 LAB — VITAMIN D 1,25 DIHYDROXY
VITAMIN D 1, 25 (OH) TOTAL: 47 pg/mL (ref 18–72)
Vitamin D2 1, 25 (OH)2: 23 pg/mL
Vitamin D3 1, 25 (OH)2: 24 pg/mL

## 2018-01-23 MED ORDER — VITAMIN D3 75 MCG (3000 UT) PO TABS: 1.0000 | ORAL_TABLET | Freq: Every day | ORAL | Status: AC

## 2018-01-23 NOTE — Telephone Encounter (Signed)
Copied from CRM [id number]. Topic: General - Other >> Jan 23, 2018  9:24 AM Arlyss Gandyichardson, Taren N, NT wrote: Reason for CRM: Pt is wanting to know if her short term disability paper work has been sent in to [employer]. Please advise.

## 2018-01-23 NOTE — Telephone Encounter (Signed)
Vitamin D looks good. Stop weekly supplement and begin otc vit D 3000iu once daily.  Iron level looks good. Please continue iron supplement.

## 2018-01-24 ENCOUNTER — Telehealth: Payer: Self-pay | Admitting: Family

## 2018-01-24 NOTE — Telephone Encounter (Signed)
Have not received, did she leave with provider at office visit/SLS

## 2018-01-24 NOTE — Telephone Encounter (Signed)
See mychart message. Left a phone message to check mychart.

## 2018-01-27 ENCOUNTER — Telehealth: Payer: Self-pay | Admitting: *Deleted

## 2018-01-27 NOTE — Telephone Encounter (Signed)
Patient walked in today to get a copy of the form, copy was made for patient's records.

## 2018-01-27 NOTE — Telephone Encounter (Signed)
[facility]; forwarded to [employer] Forms via fax, pt is aware and has copy/SLS 08/16

## 2018-01-27 NOTE — Telephone Encounter (Signed)
Haley MouldingRuth --  Do you still have the forms to fax for pt per her mychart request above?

## 2018-01-27 NOTE — Telephone Encounter (Signed)
Patient came to pick up copy of forms earlier today. Haley Carlson had forms in the office, I made a copy and gave to patient. Per Haley Carlson she will fax forms today.

## 2018-02-16 ENCOUNTER — Other Ambulatory Visit: Payer: Self-pay | Admitting: Family

## 2018-02-20 ENCOUNTER — Other Ambulatory Visit: Payer: Self-pay | Admitting: Family

## 2018-02-20 ENCOUNTER — Ambulatory Visit (HOSPITAL_BASED_OUTPATIENT_CLINIC_OR_DEPARTMENT_OTHER)
Admission: RE | Admit: 2018-02-20 | Discharge: 2018-02-20 | Disposition: A | Discharge status: Home / Self Care | Payer: BLUE CROSS/BLUE SHIELD | Source: Ambulatory Visit | Attending: Family | Admitting: Family

## 2018-02-20 ENCOUNTER — Encounter: Payer: Self-pay | Admitting: Family

## 2018-02-20 ENCOUNTER — Ambulatory Visit (INDEPENDENT_AMBULATORY_CARE_PROVIDER_SITE_OTHER): Payer: BLUE CROSS/BLUE SHIELD | Admitting: Family

## 2018-02-20 VITALS — BP 111/68 | HR 70 | Temp 98.2°F | Resp 18 | Ht 65.0 in | Wt 168.8 lb

## 2018-02-20 DIAGNOSIS — M50322 Other cervical disc degeneration at C5-C6 level: Secondary | ICD-10-CM | POA: Diagnosis not present

## 2018-02-20 DIAGNOSIS — G8929 Other chronic pain: Secondary | ICD-10-CM | POA: Insufficient documentation

## 2018-02-20 DIAGNOSIS — M542 Cervicalgia: Secondary | ICD-10-CM

## 2018-02-20 DIAGNOSIS — F329 Major depressive disorder, single episode, unspecified: Secondary | ICD-10-CM

## 2018-02-20 DIAGNOSIS — M25559 Pain in unspecified hip: Secondary | ICD-10-CM

## 2018-02-20 DIAGNOSIS — M549 Dorsalgia, unspecified: Secondary | ICD-10-CM

## 2018-02-20 DIAGNOSIS — I700 Atherosclerosis of aorta: Secondary | ICD-10-CM | POA: Insufficient documentation

## 2018-02-20 DIAGNOSIS — M797 Fibromyalgia: Secondary | ICD-10-CM

## 2018-02-20 DIAGNOSIS — F32A Depression, unspecified: Secondary | ICD-10-CM

## 2018-02-20 NOTE — Progress Notes (Signed)
Subjective:    Patient ID: Haley Carlson, female    DOB: [birth year], 52 y.o.   MRN: [id number]  HPI  Patient is a 52 yr old female who presents today for follow up.   Fibromyalgia- no significant change in her fibromyalgia pain with the increase in her cymbalta.   Depression- last visit she was noted to have uncontrolled depression symptoms.  We increased her cymbalta from 30mg  to 60mg .  We decreased her lexapro dose.  Now having low back pain/stiffness/hip left hip hurting.  She is scheduled to return to work.  Denies any active plan for suicide.  Still feeling depressed.  Depression screen [facility] 2/9 01/18/2018 10/07/2017 02/11/2017 12/13/2015 07/27/2015  Decreased Interest 3 3 0 0 0  Down, Depressed, Hopeless 2 2 0 0 0  PHQ - 2 Score 5 5 0 0 0  Altered sleeping 3 2 0 - -  Tired, decreased energy 3 2 0 - -  Change in appetite 3 2 0 - -  Feeling bad or failure about yourself  1 1 0 - -  Trouble concentrating 3 1 0 - -  Moving slowly or fidgety/restless 3 2 0 - -  Suicidal thoughts 0 0 0 - -  PHQ-9 Score 21 15 0 - -  Difficult doing work/chores Extremely dIfficult Very difficult Not difficult at all - -    Hurt back in 2016 while working.  Feels like her left lower back spasms. Wears a tens unit when she works which helps.  She also has pain in neck/shoulders, pain in her hips. Did not note much improvement with the meloxicam so she is no longer taking. She is scheduled to return to work.   Review of Systems See HPI  Past Medical History:  Diagnosis Date  . Depression   . Implanon removal    PT HAD IMPLANON INSERTED 12/2009 AND REMOVED 03/17/10  . Osteopenia 10/15/2016   T score -2.4 10/15/16  . Substance abuse (HCC)    cocaine, marijuana, alcohol; sober since 10/2008  . SVT (supraventricular tachycardia) (HCC)      Social History   Socioeconomic History  . Marital status: Legally Separated    Spouse name: Not on file  . Number of children: 3  . Years of education: Not on file    . Highest education level: 12th grade  Occupational History    Employer: [employer]  Social Needs  . Financial resource strain: Not on file  . Food insecurity:    Worry: Not on file    Inability: Not on file  . Transportation needs:    Medical: Not on file    Non-medical: Not on file  Tobacco Use  . Smoking status: Current Every Day Smoker    Packs/day: 0.50    Years: 25.00    Pack years: 12.50    Types: Cigarettes  . Smokeless tobacco: Never Used  Substance and Sexual Activity  . 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 Drug use: No    Comment: remote use cocain, methamphetamine, cocaine, marijuana  . Sexual activity: Yes    Partners: Male    Birth control/protection: Post-menopausal  Lifestyle  . Physical activity:    Days per week: Not on file    Minutes per session: Not on file  . Stress: Not on file  Relationships  . Social connections:    Talks on phone: Not on file  Gets together: Not on file    Attends religious service: Not on file    Active member of club or organization: Not on file    Attends meetings of clubs or organizations: Not on file    Relationship status: Not on file  . Intimate partner violence:    Fear of current or ex partner: Not on file    Emotionally abused: Not on file    Physically abused: Not on file    Forced sexual activity: Not on file  Other Topics Concern  . Not on file  Social History Narrative   Works at [employer] as a Holiday representative   Married to her ex husband, has a boyfriend of 5 years   3 children   1998- son, at [employer] [place]   2001-son local    2004- daughter    One dog ("sancho")      Patient is right-handed. She drinks 1 cup of coffee a day. She does not exercise.    Past Surgical History:  Procedure Laterality Date  . AUGMENTATION MAMMAPLASTY  2009   IMPLANTS  . ENDOMETRIAL ABLATION  11/2007   HER OPTION ABLATION  . EXTERNAL EAR SURGERY  1993   CYST  REMOVED    Family History  Problem Relation Age of Onset  . Hypertension Mother   . Cancer Mother        breast cancer 2014  . Thyroid disease Mother        Hashimoto's  . Dementia Father     No Active Allergies  Current Outpatient Medications on File Prior to Visit  Medication Sig Dispense Refill  . Cholecalciferol (VITAMIN D3) 3000 units TABS Take 1 tablet by mouth daily. 30 tablet   . DULoxetine (CYMBALTA) 60 MG capsule Take 1 capsule (60 mg total) by mouth daily. 30 capsule 3  . escitalopram (LEXAPRO) 20 MG tablet Take 0.5 tablets (10 mg total) by mouth daily. TAKE 1 TABLET BY MOUTH ONCE DAILY (Patient taking differently: Take 10 mg by mouth daily. ) 45 tablet 1  . ferrous sulfate 325 (65 FE) MG tablet Take 325 mg by mouth 2 (two) times daily with a meal.    . gabapentin (NEURONTIN) 100 MG capsule TAKE 1 CAPSULE BY MOUTH THREE TIMES DAILY 90 capsule 3  . magnesium gluconate (MAGONATE) 500 MG tablet Take 500 mg by mouth daily.    Marland Kitchen OVER THE COUNTER MEDICATION BC ARTHRITIS    . OVER THE COUNTER MEDICATION CBD capsules. Take 1 capsule daily as needed.     No current facility-administered medications on file prior to visit.     BP 111/68 (BP Location: Right Arm, Cuff Size: Normal)   Pulse 70   Temp 98.2 F (36.8 C) (Oral)   Resp 18   Ht 5\' 5"  (1.651 m)   Wt 168 lb 12.8 oz (76.6 kg)   LMP 07/18/2011   SpO2 100%   BMI 28.09 kg/m       Objective:   Physical Exam  Constitutional: She is oriented to person, place, and time. She appears well-developed and well-nourished.  Cardiovascular: Normal rate, regular rhythm and normal heart sounds.  No murmur heard. Pulmonary/Chest: Effort normal and breath sounds normal. No respiratory distress. She has no wheezes.  Musculoskeletal: She exhibits no edema.       Cervical back: She exhibits no tenderness.       Thoracic back: She exhibits no tenderness.       Lumbar back: She exhibits no tenderness.  Neurological: She is alert  and oriented to person, place, and time.  Skin: Skin is warm and dry.  Psychiatric: She has a normal mood and affect. Her behavior is normal. Judgment and thought content normal.          Assessment & Plan:  Bilateral Hip pain- obtain x-ray of hips.  Neck pain- obtain x-ray of c-spine  Back pain- obtain x-ray of thoracic spine and lumbar spine.  Depression- uncontrolled.  She feels pain is contributing.  I recommended referral to psychiatry. Notes hopelessness at times but denies any plans of suicide. Advised her to call 911 if she develops suicidal plan. Continue lexapro/cymbalta.  Defer further changes to psychiatry.   Fibromyalgia- she has not yet increased the dose of her gabapentin to 300mg  which she plans to begin today.  Continue cymbalta.

## 2018-02-20 NOTE — Patient Instructions (Addendum)
Please increase gabapentin to 300mg . Schedule an appointment with psychiatry as below. Complete imaging on the first floor.      Psychiatric Services:  [facility], [address] [place]   [phone] [facility], [address], [place], [place] [phone] [facility] [phone] [facility], [address] [place], [place] [phone] [facility], [address] [place], [place] [phone]

## 2018-02-24 NOTE — Telephone Encounter (Signed)
Resend of paperwork via fax both numbers on forms per patient note on 09/09//19; received confirmation just as before 02/24/18

## 2018-03-03 ENCOUNTER — Telehealth: Payer: Self-pay | Admitting: *Deleted

## 2018-03-03 NOTE — Telephone Encounter (Signed)
Copied from CRM [phone]. Topic: General - Other >> Mar 02, 2018  8:41 AM Ronney LionArrington, Shykila A wrote: Reason for CRM: Patient called in wanting to have her PCP send a note with her last visit's information to [employer] so she can have her short term disability extended. Patient says her PCP can confirm this with her via My Chart.

## 2018-03-03 NOTE — Telephone Encounter (Signed)
Ok to send last office note pls

## 2018-03-20 ENCOUNTER — Ambulatory Visit (INDEPENDENT_AMBULATORY_CARE_PROVIDER_SITE_OTHER): Payer: BLUE CROSS/BLUE SHIELD | Admitting: Family

## 2018-03-20 ENCOUNTER — Encounter: Payer: Self-pay | Admitting: Family

## 2018-03-20 VITALS — BP 122/80 | HR 73 | Temp 98.1°F | Resp 16 | Ht 65.0 in | Wt 162.4 lb

## 2018-03-20 DIAGNOSIS — F32A Depression, unspecified: Secondary | ICD-10-CM

## 2018-03-20 DIAGNOSIS — F329 Major depressive disorder, single episode, unspecified: Secondary | ICD-10-CM

## 2018-03-20 DIAGNOSIS — M797 Fibromyalgia: Secondary | ICD-10-CM

## 2018-03-20 MED ORDER — DULOXETINE HCL 30 MG PO CPEP: 30.0000 mg | ORAL_CAPSULE | Freq: Every day | ORAL | 3 refills | Status: AC

## 2018-03-20 NOTE — Progress Notes (Signed)
Subjective:    Patient ID: Haley Carlson, female    DOB: 1966/06/07, 52 y.o.   MRN: [id number]  HPI  Patient is a 52 yr old female who presents today for follow up.  Depression- last visit we advised referral to psychiatry. She feels worse on the higher dose of cymbalta.  Not eating much.  Drinking fair amounts of liquids.  She reports that she found a psychiatrist who specializes in chronic pain.  She is eager to make an appointment however there is $150 upfront charge that she is working on getting together.  She states that she felt better on the higher dose of Lexapro and the lower dose of Cymbalta.  She denies any plan of suicide.  She does note some hopelessness some days and that it would "seem better I was not here.  She states that she does not feel she would ever act on that.  Wt Readings from Last 3 Encounters:  03/20/18 162 lb 6.4 oz (73.7 kg)  02/20/18 168 lb 12.8 oz (76.6 kg)  01/20/18 172 lb (78 kg)   Fibromyalgia- gabapentin dose was increased to tid.   Reports pain is not as bad when she is not working.     Review of Systems See HPI  Past Medical History:  Diagnosis Date  . Depression   . Implanon removal    PT HAD IMPLANON INSERTED 12/2009 AND REMOVED 03/17/10  . Osteopenia 10/15/2016   T score -2.4 10/15/16  . Substance abuse (HCC)    cocaine, marijuana, alcohol; sober since 10/2008  . SVT (supraventricular tachycardia) (HCC)      Social History   Socioeconomic History  . Marital status: Legally Separated    Spouse name: Not on file  . Number of children: 3  . Years of education: Not on file  . Highest education level: 12th grade  Occupational History    Employer: [employer]  Social Needs  . Financial resource strain: Not on file  . Food insecurity:    Worry: Not on file    Inability: Not on file  . Transportation needs:    Medical: Not on file    Non-medical: Not on file  Tobacco Use  . Smoking status: Current Every Day Smoker    Packs/day: 0.50      Years: 25.00    Pack years: 12.50    Types: Cigarettes  . Smokeless tobacco: Never Used  Substance and Sexual Activity  . 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 Drug use: No    Comment: remote use cocain, methamphetamine, cocaine, marijuana  . Sexual activity: Yes    Partners: Male    Birth control/protection: Post-menopausal  Lifestyle  . Physical activity:    Days per week: Not on file    Minutes per session: Not on file  . Stress: Not on file  Relationships  . Social connections:    Talks on phone: Not on file    Gets together: Not on file    Attends religious service: Not on file    Active member of club or organization: Not on file    Attends meetings of clubs or organizations: Not on file    Relationship status: Not on file  . Intimate partner violence:    Fear of current or ex partner: Not on file    Emotionally abused: Not on file    Physically abused: Not on file  Forced sexual activity: Not on file  Other Topics Concern  . Not on file  Social History Narrative   Works at [employer] as a Holiday representative   Married to her ex husband, has a boyfriend of 5 years   3 children   1998- son, at [employer] [place]   2001-son local    2004- daughter    One dog ("sancho")      Patient is right-handed. She drinks 1 cup of coffee a day. She does not exercise.    Past Surgical History:  Procedure Laterality Date  . AUGMENTATION MAMMAPLASTY  2009   IMPLANTS  . ENDOMETRIAL ABLATION  11/2007   HER OPTION ABLATION  . EXTERNAL EAR SURGERY  1993   CYST REMOVED    Family History  Problem Relation Age of Onset  . Hypertension Mother   . Cancer Mother        breast cancer 2014  . Thyroid disease Mother        Hashimoto's  . Dementia Father     No Active Allergies  Current Outpatient Medications on File Prior to Visit  Medication Sig Dispense Refill  . Cholecalciferol (VITAMIN D3) 3000 units TABS Take 1 tablet  by mouth daily. 30 tablet   . escitalopram (LEXAPRO) 20 MG tablet Take 0.5 tablets (10 mg total) by mouth daily. TAKE 1 TABLET BY MOUTH ONCE DAILY (Patient taking differently: Take 10 mg by mouth daily. ) 45 tablet 1  . ferrous sulfate 325 (65 FE) MG tablet Take 325 mg by mouth 2 (two) times daily with a meal.    . gabapentin (NEURONTIN) 100 MG capsule TAKE 1 CAPSULE BY MOUTH THREE TIMES DAILY 90 capsule 3  . magnesium gluconate (MAGONATE) 500 MG tablet Take 500 mg by mouth daily.    Marland Kitchen OVER THE COUNTER MEDICATION BC ARTHRITIS    . OVER THE COUNTER MEDICATION CBD capsules. Take 1 capsule daily as needed.     No current facility-administered medications on file prior to visit.     BP 122/80 (BP Location: Right Arm, Patient Position: Sitting, Cuff Size: Small)   Pulse 73   Temp 98.1 F (36.7 C) (Oral)   Resp 16   Ht 5\' 5"  (1.651 m)   Wt 162 lb 6.4 oz (73.7 kg)   LMP 07/18/2011   SpO2 98%   BMI 27.02 kg/m       Objective:   Physical Exam  Constitutional: She is oriented to person, place, and time. She appears well-developed and well-nourished. No distress.  Musculoskeletal: She exhibits no edema.  Neurological: She is alert and oriented to person, place, and time.  Psychiatric: Her behavior is normal. Judgment and thought content normal.  tearful          Assessment & Plan:  Depression-this is uncontrolled.  Will increase Lexapro back up to 20 mg once a day and decrease her Cymbalta from 60 to 30 mg once daily.  She is encouraged to follow through with psychiatry referral as planned.  She understands that should she develop active thoughts of hurting self herself or others she is to call 911.  Fibromyalgia- continues to have chronic pain issues.  Does feel that there is some improvement with the gabapentin and not working.  At this time I do not feel she is stable to return to work.  Declines flu shot today.  States she will get it from her employer

## 2018-03-20 NOTE — Patient Instructions (Signed)
Please decrease cymbalta from 60mg  to 30mg .  Increase lexapro from 10mg  to 20mg . Schedule an appointment with psychiatry.

## 2018-03-21 ENCOUNTER — Telehealth: Payer: Self-pay | Admitting: *Deleted

## 2018-03-21 NOTE — Telephone Encounter (Signed)
LMOM with contact name and number for return call, if needed RE: not receiving new STD paperwork from [facility] to extend disability dates per provider instructions/SLS 10/08  Patient returned call and I reiterated my voicemail. Since we have unfortunately not received the new paperwork from her STD insurance [[employer]], I asked her to call them and ask them to fax to our clinic fax at my Attention and that if I do not have it by first thing tomorrow morning [informed her that I will be leaving early today for an appointment] that I will call her and inform her of this tomorrow morning, as someone will place it on my desk if I am not here. Informed her that we will have paperwork completed in a timely manner as soon as it is received. Patient understood & agreed/SLS 10/08

## 2018-03-21 NOTE — Telephone Encounter (Signed)
-----   Message from Sandford Craze, NP sent at 03/20/2018 10:05 AM EDT ----- Do you have paperwork on her?  She is going to ask them to refax today. Please let me know when you get.

## 2018-03-22 ENCOUNTER — Telehealth: Payer: Self-pay | Admitting: *Deleted

## 2018-03-22 NOTE — Telephone Encounter (Signed)
Spoke with representative at [facility] at [phone], and explained to her that our patient has been calling us to tell us that [employer] states that have sent disability forms over to our office numerous of times but we have not received them. I asked patient to call yesterday and have them sent to the clinic fax but again no paperwork today. This is for extension of STD. The representative stated that this is because there is no paperwork that is sent out for an extension on an active STD claim, they only need the last updated OV notes faxed to them at [phone] with the patient's Claim number written on every page. Faxed the 02/20/18 & 03/20/18 notes over, as I believe the 02/20/18 notes may have been sent to the incorrect fax number by the note on 03/03/18. Patient informed, understood & agreed/SLS 10/09

## 2018-03-22 NOTE — Telephone Encounter (Signed)
Opened in Error.

## 2018-03-22 NOTE — Telephone Encounter (Signed)
LMOM with contact name and number for return call RE: not receiving disability paperwork yet, informed her that I am looking through her past paperwork for a phone number, as they are Haley Carlson, which will be a little different. I attempted to call [facility] at [phone], but received a recoding that their hours are 8am -5pm Central time, will call back after 9am our time/SLS 10/09

## 2018-03-27 ENCOUNTER — Telehealth: Payer: Self-pay | Admitting: *Deleted

## 2018-03-27 NOTE — Telephone Encounter (Signed)
Received FMLA/STD paperwork from [facility] at [place], they are requesting OV notes that were sent to them om 03/22/18, along with a RTW Certification form; forwarded to provider/SLS 10/14

## 2018-03-28 NOTE — Telephone Encounter (Signed)
Received second set of paperwork with 'Request for Clarification of Medical Information to support Disability from Clinical Specialist' with a set of questions, and a Release of Information form signed by patient; forwarded to provider/SLS 10/15

## 2018-03-28 NOTE — Telephone Encounter (Signed)
Lucy with [employer] calling to ask if you got this paperwork, advised Valentina Gu you did.  She states any questions, please call.

## 2018-04-05 ENCOUNTER — Ambulatory Visit: Payer: Self-pay | Admitting: Family

## 2018-04-06 ENCOUNTER — Emergency Department (HOSPITAL_BASED_OUTPATIENT_CLINIC_OR_DEPARTMENT_OTHER): Payer: BLUE CROSS/BLUE SHIELD

## 2018-04-06 ENCOUNTER — Encounter (HOSPITAL_BASED_OUTPATIENT_CLINIC_OR_DEPARTMENT_OTHER): Payer: Self-pay

## 2018-04-06 ENCOUNTER — Emergency Department (HOSPITAL_BASED_OUTPATIENT_CLINIC_OR_DEPARTMENT_OTHER)
Admission: EM | Admit: 2018-04-06 | Discharge: 2018-04-06 | Disposition: A | Discharge status: Home / Self Care | Payer: BLUE CROSS/BLUE SHIELD | Attending: Emergency Medicine | Admitting: Emergency Medicine

## 2018-04-06 ENCOUNTER — Other Ambulatory Visit: Payer: Self-pay

## 2018-04-06 DIAGNOSIS — Z79899 Other long term (current) drug therapy: Secondary | ICD-10-CM | POA: Diagnosis not present

## 2018-04-06 DIAGNOSIS — F17210 Nicotine dependence, cigarettes, uncomplicated: Secondary | ICD-10-CM | POA: Insufficient documentation

## 2018-04-06 DIAGNOSIS — R079 Chest pain, unspecified: Secondary | ICD-10-CM | POA: Insufficient documentation

## 2018-04-06 DIAGNOSIS — R0789 Other chest pain: Secondary | ICD-10-CM

## 2018-04-06 LAB — BASIC METABOLIC PANEL
ANION GAP: 11 (ref 5–15)
BUN: 23 mg/dL — ABNORMAL HIGH (ref 6–20)
CO2: 24 mmol/L (ref 22–32)
Calcium: 9.4 mg/dL (ref 8.9–10.3)
Chloride: 103 mmol/L (ref 98–111)
Creatinine, Ser: 0.6 mg/dL (ref 0.44–1.00)
GFR calc non Af Amer: >60 mL/min (ref >60)
Glucose, Bld: 89 mg/dL (ref 70–99)
POTASSIUM: 3.7 mmol/L (ref 3.5–5.1)
SODIUM: 138 mmol/L (ref 135–145)

## 2018-04-06 LAB — CBC
HCT: 40.1 % (ref 36.0–46.0)
Hemoglobin: 12.8 g/dL (ref 12.0–15.0)
MCH: 28.8 pg (ref 26.0–34.0)
MCHC: 31.9 g/dL (ref 30.0–36.0)
MCV: 90.1 fL (ref 80.0–100.0)
NRBC: 0 % (ref 0.0–0.2)
Platelets: 259 10*3/uL (ref 150–400)
RBC: 4.45 MIL/uL (ref 3.87–5.11)
RDW: 13.7 % (ref 11.5–15.5)
WBC: 9.6 10*3/uL (ref 4.0–10.5)

## 2018-04-06 LAB — TROPONIN I: Troponin I: <0.03 ng/mL (ref <0.03)

## 2018-04-06 LAB — D-DIMER, QUANTITATIVE (NOT AT ARMC)

## 2018-04-06 MED ORDER — DICLOFENAC SODIUM 1 % TD GEL: 2.0000 g | Freq: Four times a day (QID) | TRANSDERMAL | 0 refills | Status: AC

## 2018-04-06 MED ORDER — KETOROLAC TROMETHAMINE 15 MG/ML IJ SOLN
15.0000 mg | Freq: Once | INTRAMUSCULAR | Status: AC
  Administered 2018-04-06: 15 mg via INTRAVENOUS
  Filled 2018-04-06: qty 1

## 2018-04-06 MED ORDER — IBUPROFEN 800 MG PO TABS: 800.0000 mg | ORAL_TABLET | Freq: Three times a day (TID) | ORAL | 0 refills | Status: AC

## 2018-04-06 NOTE — ED Triage Notes (Signed)
C/o right side CP x 2 weeks-worse with cough, sneeze, movement-NAD-steady gait

## 2018-04-06 NOTE — Discharge Instructions (Signed)
Take ibuprofen 3 times a day with meals.  Do not take other anti-inflammatories at the same time (Advil, Motrin, naproxen, Aleve). You may supplement with Tylenol if you need further pain control. Use voltaren gel as needed for pain.  Follow up with your primary care doctor in 1 week if your pain is not improving.  Return to the ER if you develop high fevers, difficulty breathing, or any new or concerning symptoms.

## 2018-04-06 NOTE — ED Provider Notes (Signed)
[facility] EMERGENCY DEPARTMENT Provider Note   CSN: [id number] Arrival date & time: 04/06/18  1258     History   Chief Complaint Chief Complaint  Patient presents with  . Chest Pain    HPI Haley Carlson is a 52 y.o. female presenting for evaluation of right-sided chest pain.  Patient states the past 2 weeks, she has been having persistent right-sided chest pain.  Lungs along the right side of her sternum and goes under her breast.  Pain is worse with movement, deep breaths, coughing, and sneezing.  She denies fall, trauma, or injury.  She is not coughing more than normal.  She denies URI/viral symptoms including sore throat, fever, chills, nasal congestion, or worsening cough.  She denies shortness of breath, nausea, vomiting, abdominal pain, urinary symptoms, normal bowel movements.  She does travel frequently, is often in a car for more than 4 hours at a time.  She denies leg pain or swelling.  She denies trauma, surgeries, immobilization, history of cancer, history of previous DVT/PE.  She is not on hormones.  She has been taking Advil with mild improvement of her symptoms.  She has not tried anything else.  Patient with a history of fibromyalgia, chronic back pain, and history of polysubstance abuse, sober since 2010.  HPI  Past Medical History:  Diagnosis Date  . Depression   . Implanon removal    PT HAD IMPLANON INSERTED 12/2009 AND REMOVED 03/17/10  . Osteopenia 10/15/2016   T score -2.4 10/15/16  . Substance abuse (HCC)    cocaine, marijuana, alcohol; sober since 10/2008  . SVT (supraventricular tachycardia) [facility])     Patient Active Problem List   Diagnosis Date Noted  . Iron deficiency anemia 10/12/2017  . Vitamin D deficiency 10/12/2017  . Fibromyalgia 02/11/2017  . Osteopenia 10/15/2016  . 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 Cervical pain (neck) 06/29/2016  . Depression 06/29/2013  . History of substance abuse (HCC) 06/29/2013    Past Surgical  History:  Procedure Laterality Date  . AUGMENTATION MAMMAPLASTY  2009   IMPLANTS  . ENDOMETRIAL ABLATION  11/2007   HER OPTION ABLATION  . EXTERNAL EAR SURGERY  1993   CYST REMOVED     OB History   None      Home Medications    Prior to Admission medications   Medication Sig Start Date End Date Taking? Authorizing Provider  Cholecalciferol (VITAMIN D3) 3000 units TABS Take 1 tablet by mouth daily. 01/23/18   Sandford Craze, NP  diclofenac sodium (VOLTAREN) 1 % GEL Apply 2 g topically 4 (four) times daily. 04/06/18   Alannie Amodio, PA-C  DULoxetine (CYMBALTA) 30 MG capsule Take 1 capsule (30 mg total) by mouth daily. 03/20/18   Sandford Craze, NP  escitalopram (LEXAPRO) 20 MG tablet Take 0.5 tablets (10 mg total) by mouth daily. TAKE 1 TABLET BY MOUTH ONCE DAILY Patient taking differently: Take 10 mg by mouth daily.  01/18/18   Sandford Craze, NP  ferrous sulfate 325 (65 FE) MG tablet Take 325 mg by mouth 2 (two) times daily with a meal.    [provider]  gabapentin (NEURONTIN) 100 MG capsule TAKE 1 CAPSULE BY MOUTH THREE TIMES DAILY 02/17/18   Sandford Craze, NP  ibuprofen (ADVIL,MOTRIN) 800 MG tablet Take 1 tablet (800 mg total) by mouth 3 (three) times daily. 04/06/18   Raynald Rouillard, PA-C  magnesium gluconate (MAGONATE) 500 MG tablet Take 500 mg by mouth daily.    [provider]  OVER THE COUNTER MEDICATION BC ARTHRITIS    [provider]  OVER THE COUNTER MEDICATION CBD capsules. Take 1 capsule daily as needed.    [provider]    Family History Family History  Problem Relation Age of Onset  . Hypertension Mother   . Cancer Mother        breast cancer 2014  . Thyroid disease Mother        Hashimoto's  . Dementia Father     Social History Social History   Tobacco Use  . Smoking status: Current Every Day Smoker    Packs/day: 0.50    Years: 25.00    Pack years: 12.50    Types: Cigarettes  . Smokeless  tobacco: Never Used  Substance Use Topics  . 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 Drug use: Not Currently    Comment: in recovery     Allergies   Patient has no known allergies.   Review of Systems Review of Systems  Cardiovascular: Positive for chest pain (with movement, cough and sneeze).  All other systems reviewed and are negative.    Physical Exam Updated Vital Signs BP 109/79 (BP Location: Right Arm)   Pulse (!) 57   Temp 98 F (36.7 C) (Oral)   Resp 16   Wt 73.5 kg   LMP 07/18/2011   SpO2 98%   BMI 26.97 kg/m   Physical Exam  Constitutional: She is oriented to person, place, and time. She appears well-developed and well-nourished. No distress.  Sitting comfortably in the bed in no acute distress  HENT:  Head: Normocephalic and atraumatic.  OP clear without tonsillar swelling or exudate.  Uvula midline with equal palate rise.  Eyes: Pupils are equal, round, and reactive to light. Conjunctivae and EOM are normal.  Neck: Normal range of motion. Neck supple.  Cardiovascular: Normal rate, regular rhythm and intact distal pulses.  Pulmonary/Chest: Effort normal and breath sounds normal. No respiratory distress. She has no wheezes. She exhibits tenderness.  Tenderness palpation along the right side of the sternum and right lower chest.  No deformities.  Speaking in full sentences.  Clear lung sounds in all fields.  Abdominal: Soft. She exhibits no distension. There is no tenderness.  Musculoskeletal: Normal range of motion. She exhibits no edema or tenderness.  No leg pain or swelling.  Pedal pulses intact bilaterally.  Neurological: She is alert and oriented to person, place, and time.  Skin: Skin is warm and dry. Capillary refill takes less than 2 seconds.  Psychiatric: She has a normal mood and affect.  Nursing note and vitals reviewed.    ED Treatments / Results  Labs (all labs ordered are  listed, but only abnormal results are displayed) Labs Reviewed  BASIC METABOLIC PANEL - Abnormal; Notable for the following components:      Result Value   BUN 23 (*)    All other components within normal limits  CBC  TROPONIN I  D-DIMER, QUANTITATIVE (NOT AT [facility])    EKG EKG Interpretation  Date/Time:  Thursday April 06 2018 13:10:12 EDT Ventricular Rate:  76 PR Interval:  188 QRS Duration: 80 QT Interval:  372 QTC Calculation: 418 R Axis:   77 Text Interpretation:  Normal sinus rhythm Normal ECG Confirmed by Vanetta Mulders [phone]) on 04/06/2018 2:53:37 PM   Radiology Dg Chest 2 View  Result Date: 04/06/2018 CLINICAL DATA:  Chest pain EXAM: CHEST -  2 VIEW COMPARISON:  April 18, 2016 FINDINGS: Lungs are clear. Heart size and pulmonary vascularity are normal. No adenopathy. No pneumothorax. No bone lesions. IMPRESSION: No edema or consolidation. Electronically Signed   By: Bretta Bang III M.D.   On: 04/06/2018 13:36    Procedures Procedures (including critical care time)  Medications Ordered in ED Medications  ketorolac (TORADOL) 15 MG/ML injection 15 mg (15 mg Intravenous Given 04/06/18 1431)     Initial Impression / Assessment and Plan / ED Course  I have reviewed the triage vital signs and the nursing notes.  Pertinent labs & imaging results that were available during my care of the patient were reviewed by me and considered in my medical decision making (see chart for details).     Patient presented for evaluation of right-sided chest pain for the past 2 weeks.  Symptoms are worse with movement, coughing, and sneezing.  Physical exam reassuring, she is afebrile not tachycardic.  Appears nontoxic.  Pain is reproducible with palpation of the chest wall.  Clear lung sounds in all fields.  Initial work-up reassuring, EKG shows normal sinus.  Chest x-ray viewed interpreted by me, no pneumonia, pneumothorax, effusion, or cardiomegaly.  Patient without leg pain  or swelling, low risk for PE, however does travel frequently with trips longer than 4 hours.  Will obtain d-dimer to rule out PE. Basic labs including troponin reassuring, doubt acs.   Dimer negative.  Pain improved with Toradol.  Discussed findings with patient.  Discussed likely chest wall pain/costochondritis.  Discussed treatment with NSAIDs and follow-up with PCP.  At this time, patient appears safe for discharge.  Return precautions given.  Patient states she understands and agrees to plan.   Final Clinical Impressions(s) / ED Diagnoses   Final diagnoses:  Atypical chest pain    ED Discharge Orders         Ordered    ibuprofen (ADVIL,MOTRIN) 800 MG tablet  3 times daily     04/06/18 1505    diclofenac sodium (VOLTAREN) 1 % GEL  4 times daily     04/06/18 1505           Rajinder Mesick, PA-C 04/06/18 1542    Vanetta Mulders, MD 04/07/18 0840

## 2018-04-17 ENCOUNTER — Ambulatory Visit: Payer: Self-pay | Admitting: Family

## 2018-04-18 ENCOUNTER — Encounter: Payer: Self-pay | Admitting: Family

## 2018-04-18 ENCOUNTER — Ambulatory Visit (INDEPENDENT_AMBULATORY_CARE_PROVIDER_SITE_OTHER): Payer: BLUE CROSS/BLUE SHIELD | Admitting: Family

## 2018-04-18 VITALS — BP 114/55 | HR 73 | Temp 98.0°F | Resp 16 | Ht 65.0 in | Wt 159.0 lb

## 2018-04-18 DIAGNOSIS — F418 Other specified anxiety disorders: Secondary | ICD-10-CM | POA: Diagnosis not present

## 2018-04-18 DIAGNOSIS — H1131 Conjunctival hemorrhage, right eye: Secondary | ICD-10-CM | POA: Diagnosis not present

## 2018-04-18 DIAGNOSIS — G8929 Other chronic pain: Secondary | ICD-10-CM

## 2018-04-18 NOTE — Progress Notes (Signed)
Subjective:    Patient ID: Haley Carlson, female    DOB: Apr 30, 1966, 52 y.o.   MRN: [id number]  HPI  Patient is a 52 year old female who presents today for follow-up on her depression.  We last saw her one month ago at which time she did note some hopelessness some days but denied active suicidal thoughts.  She noted that she was not feeling as well on the increased dose of Cymbalta.  We increased her Lexapro dose back to 20 mg and decreased her Cymbalta dose from 60 mg to 30 mg. She reports feeling better on this regimen but still remains depressed.  She notes that she has extreme anxiety when she thinks about returning to work.  She feels like she physically cannot bring herself to walk back into the building.  Reports that her boyfriend is sick and lost his job.  Had flare up of costochondritis.  She is seeing Neysa Bonito at [facility] psychiatry and saw her yesterday. No changes were made to her medication but she will follow back up with her in 2 weeks.  Reports that she rubbed her eye on Sunday and then noted blood in the lateral eye.   Reports that her fibro pain was bad yesterday, but worsened by the weather.  Back starts to hurt if she does too many chores. Overall better than when she was working because she is not standing.    Review of Systems See HPI  Past Medical History:  Diagnosis Date  . Depression   . Implanon removal    PT HAD IMPLANON INSERTED 12/2009 AND REMOVED 03/17/10  . Osteopenia 10/15/2016   T score -2.4 10/15/16  . Substance abuse (HCC)    cocaine, marijuana, alcohol; sober since 10/2008  . SVT (supraventricular tachycardia) (HCC)      Social History   Socioeconomic History  . Marital status: Legally Separated    Spouse name: Not on file  . Number of children: 3  . Years of education: Not on file  . Highest education level: 12th grade  Occupational History    Employer: [employer]  Social Needs  . Financial resource strain: Not on file  . Food insecurity:      Worry: Not on file    Inability: Not on file  . Transportation needs:    Medical: Not on file    Non-medical: Not on file  Tobacco Use  . Smoking status: Current Every Day Smoker    Packs/day: 0.50    Years: 25.00    Pack years: 12.50    Types: Cigarettes  . Smokeless tobacco: Never Used  Substance and Sexual Activity  . 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 Drug use: Not Currently    Comment: in recovery  . Sexual activity: Not on file  Lifestyle  . Physical activity:    Days per week: Not on file    Minutes per session: Not on file  . Stress: Not on file  Relationships  . Social connections:    Talks on phone: Not on file    Gets together: Not on file    Attends religious service: Not on file    Active member of club or organization: Not on file    Attends meetings of clubs or organizations: Not on file    Relationship status: Not on file  . Intimate partner violence:    Fear of current or ex partner: Not  on file    Emotionally abused: Not on file    Physically abused: Not on file    Forced sexual activity: Not on file  Other Topics Concern  . Not on file  Social History Narrative   Works at [employer] as a Holiday representative   Married to her ex husband, has a boyfriend of 5 years   3 children   1998- son, at [employer] [place]   2001-son local    2004- daughter    One dog ("sancho")      Patient is right-handed. She drinks 1 cup of coffee a day. She does not exercise.    Past Surgical History:  Procedure Laterality Date  . AUGMENTATION MAMMAPLASTY  2009   IMPLANTS  . ENDOMETRIAL ABLATION  11/2007   HER OPTION ABLATION  . EXTERNAL EAR SURGERY  1993   CYST REMOVED    Family History  Problem Relation Age of Onset  . Hypertension Mother   . Cancer Mother        breast cancer 2014  . Thyroid disease Mother        Hashimoto's  . Dementia Father     No Known Allergies  Current Outpatient Medications  on File Prior to Visit  Medication Sig Dispense Refill  . Cholecalciferol (VITAMIN D3) 3000 units TABS Take 1 tablet by mouth daily. 30 tablet   . diclofenac sodium (VOLTAREN) 1 % GEL Apply 2 g topically 4 (four) times daily. 100 g 0  . DULoxetine (CYMBALTA) 30 MG capsule Take 1 capsule (30 mg total) by mouth daily. 30 capsule 3  . escitalopram (LEXAPRO) 20 MG tablet Take 0.5 tablets (10 mg total) by mouth daily. TAKE 1 TABLET BY MOUTH ONCE DAILY (Patient taking differently: Take 10 mg by mouth daily. ) 45 tablet 1  . ferrous sulfate 325 (65 FE) MG tablet Take 325 mg by mouth 2 (two) times daily with a meal.    . gabapentin (NEURONTIN) 100 MG capsule TAKE 1 CAPSULE BY MOUTH THREE TIMES DAILY 90 capsule 3  . ibuprofen (ADVIL,MOTRIN) 800 MG tablet Take 1 tablet (800 mg total) by mouth 3 (three) times daily. 21 tablet 0  . magnesium gluconate (MAGONATE) 500 MG tablet Take 500 mg by mouth daily.    Marland Kitchen OVER THE COUNTER MEDICATION BC ARTHRITIS    . OVER THE COUNTER MEDICATION CBD capsules. Take 1 capsule daily as needed.     No current facility-administered medications on file prior to visit.     BP (!) 114/55 (BP Location: Right Arm, Patient Position: Sitting, Cuff Size: Small)   Pulse 73   Temp 98 F (36.7 C) (Oral)   Resp 16   Ht 5\' 5"  (1.651 m)   Wt 159 lb (72.1 kg)   LMP 07/18/2011   SpO2 100%   BMI 26.46 kg/m       Objective:   Physical Exam  Constitutional: She appears well-developed and well-nourished.  Eyes:    Scleral hemorrhage  Cardiovascular: Normal rate, regular rhythm and normal heart sounds.  No murmur heard. Pulmonary/Chest: Effort normal and breath sounds normal. No respiratory distress. She has no wheezes.  Psychiatric: She has a normal mood and affect. Her behavior is normal. Judgment and thought content normal.          Assessment & Plan:  Scleral hemorrhage- advised pt this should resolve on its own.  Depression/anxiety- uncontrolled. Continues to  interfere with her ability to return to work.  I have advised her to follow  up with psychiatry as scheduled and try to get in with a counselor as well.  Chronic pain- ongoing.  Unable to stand for any prolonged time due to severity of pain.  This limits her ability to work.

## 2018-04-18 NOTE — Patient Instructions (Signed)
Please schedule an appointment with a counselor and keep your upcoming follow up with psychiatry.

## 2018-04-21 ENCOUNTER — Telehealth: Payer: Self-pay | Admitting: *Deleted

## 2018-04-21 NOTE — Telephone Encounter (Signed)
Received FMLA/STD paperwork from [facility]; completed as much as possible; forwarded to provider/ /SLS 11/08

## 2018-04-25 ENCOUNTER — Ambulatory Visit: Payer: Self-pay | Admitting: Family

## 2018-04-25 NOTE — Progress Notes (Deleted)
NEUROLOGY FOLLOW UP OFFICE NOTE  Haley Carlson [id number]  HISTORY OF PRESENT ILLNESS: Haley Carlson is a 52 year old female with depression, fibromyalgia, SVT and history of substance abuse who follows up for bilateral occipital neuralgia.  UPDATE: Intensity:  *** Duration:  *** Frequency:  *** Frequency of abortive medication: *** Current NSAIDS: Mobic Current analgesics: None Current triptans: None Current ergotamine: None Current anti-emetic: None Current muscle relaxants: None Current anti-anxiolytic: None Current sleep aide: None Current Antihypertensive medications: None Current Antidepressant medications: Lexapro 10 mg, Cymbalta 60 mg Current Anticonvulsant medications: Gabapentin 300 mg 3 times daily Current anti-CGRP: None Current Vitamins/Herbal/Supplements: Magnesium 500 mg, D Current Antihistamines/Decongestants: None Other therapy: None  Caffeine:  *** Depression:  ***; Anxiety:  *** Other pain:  *** Sleep hygiene:  ***  HISTORY:  Onset: June 2019 Location:  Bilateral occipital region radiating up top of head and to the front Quality:  Stabbing/electric Initial intensity:  severe.  She denies thunderclap headache. Aura:  no Prodrome:  no Postdrome:  no Associated symptoms: Paresthesias on back of scalp.  No nausea, vomiting, photophobia, phonophobia, osmophobia, autonomic symptoms, or visual disturbance.  She denies associated unilateral numbness or weakness. Initial duration:  Few seconds Initial Frequency:  4 times a week Initial Frequency of abortive medication: No medication Triggers: Bending down to pick something off of the floor. Exacerbating factors:  No Relieving factors:  Self-resolves Activity:  no  Past NSAIDS:  Ibuprofen, ASA 325mg  Past analgesics:  Acetaminophen, hydrocodone, tramadol Past abortive triptans:  no Past abortive ergotamine:  no Past muscle relaxants:  Cyclobenzaprine 10mg , Robaxin Past anti-emetic:  no Past  antihypertensive medications:  no Past antidepressant medications:  no Past anticonvulsant medications:  no Past anti-CGRP:  no Past vitamins/Herbal/Supplements:  no Past antihistamines/decongestants:  Flonase, Allegra Other past therapies:  No  She does have neck pain and occasionally has radiating pain and numbness down the arms.  Cervical X-ray from 06/29/16 personally reviewed and showed mild disc narrowing with endplate spurs and C5-6 and C6-7.  CT of head from 04/06/08 to assess headache was personally reviewed and was unremarkable.  PAST MEDICAL HISTORY: Past Medical History:  Diagnosis Date  . Depression   . Implanon removal    PT HAD IMPLANON INSERTED 12/2009 AND REMOVED 03/17/10  . Osteopenia 10/15/2016   T score -2.4 10/15/16  . Substance abuse (HCC)    cocaine, marijuana, alcohol; sober since 10/2008  . SVT (supraventricular tachycardia) (HCC)     MEDICATIONS: Current Outpatient Medications on File Prior to Visit  Medication Sig Dispense Refill  . Cholecalciferol (VITAMIN D3) 3000 units TABS Take 1 tablet by mouth daily. 30 tablet   . diclofenac sodium (VOLTAREN) 1 % GEL Apply 2 g topically 4 (four) times daily. 100 g 0  . DULoxetine (CYMBALTA) 30 MG capsule Take 1 capsule (30 mg total) by mouth daily. 30 capsule 3  . escitalopram (LEXAPRO) 20 MG tablet Take 0.5 tablets (10 mg total) by mouth daily. TAKE 1 TABLET BY MOUTH ONCE DAILY (Patient taking differently: Take 10 mg by mouth daily. ) 45 tablet 1  . ferrous sulfate 325 (65 FE) MG tablet Take 325 mg by mouth 2 (two) times daily with a meal.    . gabapentin (NEURONTIN) 100 MG capsule TAKE 1 CAPSULE BY MOUTH THREE TIMES DAILY 90 capsule 3  . ibuprofen (ADVIL,MOTRIN) 800 MG tablet Take 1 tablet (800 mg total) by mouth 3 (three) times daily. 21 tablet 0  . magnesium gluconate (MAGONATE) 500 MG  tablet Take 500 mg by mouth daily.    Marland Kitchen OVER THE COUNTER MEDICATION BC ARTHRITIS    . OVER THE COUNTER MEDICATION CBD capsules. Take  1 capsule daily as needed.     No current facility-administered medications on file prior to visit.     ALLERGIES: No Known Allergies  FAMILY HISTORY: Family History  Problem Relation Age of Onset  . Hypertension Mother   . Cancer Mother        breast cancer 2014  . Thyroid disease Mother        Hashimoto's  . Dementia Father    SOCIAL HISTORY: Social History   Socioeconomic History  . Marital status: Legally Separated    Spouse name: Not on file  . Number of children: 3  . Years of education: Not on file  . Highest education level: 12th grade  Occupational History    Employer: [employer]  Social Needs  . Financial resource strain: Not on file  . Food insecurity:    Worry: Not on file    Inability: Not on file  . Transportation needs:    Medical: Not on file    Non-medical: Not on file  Tobacco Use  . Smoking status: Current Every Day Smoker    Packs/day: 0.50    Years: 25.00    Pack years: 12.50    Types: Cigarettes  . Smokeless tobacco: Never Used  Substance and Sexual Activity  . 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 Drug use: Not Currently    Comment: in recovery  . Sexual activity: Not on file  Lifestyle  . Physical activity:    Days per week: Not on file    Minutes per session: Not on file  . Stress: Not on file  Relationships  . Social connections:    Talks on phone: Not on file    Gets together: Not on file    Attends religious service: Not on file    Active member of club or organization: Not on file    Attends meetings of clubs or organizations: Not on file    Relationship status: Not on file  . Intimate partner violence:    Fear of current or ex partner: Not on file    Emotionally abused: Not on file    Physically abused: Not on file    Forced sexual activity: Not on file  Other Topics Concern  . 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 She drinks 1 cup of coffee a day. She does not exercise.    REVIEW OF SYSTEMS: Constitutional: No fevers, chills, or sweats, no generalized fatigue, change in appetite Eyes: No visual changes, double vision, eye pain Ear, nose and throat: No hearing loss, ear pain, nasal congestion, sore throat Cardiovascular: No chest pain, palpitations Respiratory:  No shortness of breath at rest or with exertion, wheezes GastrointestinaI: No nausea, vomiting, diarrhea, abdominal pain, fecal incontinence Genitourinary:  No dysuria, urinary retention or frequency Musculoskeletal:  No neck pain, back pain Integumentary: No rash, pruritus, skin lesions Neurological: as above Psychiatric: No depression, insomnia, anxiety Endocrine: No  palpitations, fatigue, diaphoresis, mood swings, change in appetite, change in weight, increased thirst Hematologic/Lymphatic:  No purpura, petechiae. Allergic/Immunologic: no itchy/runny eyes, nasal congestion, recent allergic reactions, rashes  PHYSICAL EXAM: *** General: No acute distress.  Patient appears ***-groomed.  *** body habitus. Head:  Normocephalic/atraumatic Eyes:  Fundi examined but not visualized Neck: supple, no paraspinal tenderness, full range of motion Heart:  Regular rate and rhythm Lungs:  Clear to auscultation bilaterally Back: No paraspinal tenderness Neurological Exam: alert and oriented to person, place, and time. Attention span and concentration intact, recent and remote memory intact, fund of knowledge intact.  Speech fluent and not dysarthric, language intact.  CN II-XII intact. Bulk and tone normal, muscle strength 5/5 throughout.  Sensation to light touch  intact.  Deep tendon reflexes 2+ throughout, toes downgoing.  Finger to nose and heel to shin testing intact.   Gait normal, Romberg negative.  IMPRESSION: Bilateral occipital neuralgia  PLAN: ***  Shon Millet, DO  CC: Sandford Craze, NP

## 2018-04-26 ENCOUNTER — Ambulatory Visit: Payer: Self-pay | Admitting: Neurology

## 2018-05-01 NOTE — Telephone Encounter (Signed)
Pt called and stated that Haley Carlson is requesting that we fax over paperwork and office notes. Please advise Cb#[phone]

## 2018-05-08 NOTE — Telephone Encounter (Signed)
Haven't had any new paperwork since October that was completed; will keep an eye out for any new paperwork coming in, but as of this time, none to report/SLS 11/22

## 2018-05-16 ENCOUNTER — Ambulatory Visit (INDEPENDENT_AMBULATORY_CARE_PROVIDER_SITE_OTHER): Payer: BLUE CROSS/BLUE SHIELD | Admitting: Family

## 2018-05-16 ENCOUNTER — Encounter: Payer: Self-pay | Admitting: Family

## 2018-05-16 VITALS — BP 130/86 | HR 55 | Temp 98.0°F | Resp 16 | Ht 65.0 in | Wt 157.0 lb

## 2018-05-16 DIAGNOSIS — F32A Depression, unspecified: Secondary | ICD-10-CM

## 2018-05-16 DIAGNOSIS — M797 Fibromyalgia: Secondary | ICD-10-CM

## 2018-05-16 DIAGNOSIS — Z23 Encounter for immunization: Secondary | ICD-10-CM

## 2018-05-16 DIAGNOSIS — F329 Major depressive disorder, single episode, unspecified: Secondary | ICD-10-CM | POA: Diagnosis not present

## 2018-05-16 DIAGNOSIS — G894 Chronic pain syndrome: Secondary | ICD-10-CM | POA: Diagnosis not present
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
## 2018-05-16 NOTE — Patient Instructions (Signed)
Please schedule with psychiatry and continue your work with psychology.

## 2018-05-17 DIAGNOSIS — Z23 Encounter for immunization: Secondary | ICD-10-CM

## 2018-05-22 ENCOUNTER — Telehealth: Payer: Self-pay | Admitting: Family

## 2018-05-22 NOTE — Telephone Encounter (Signed)
Copied from CRM [phone]. Topic: Quick Communication - See Telephone Encounter >> May 22, 2018 10:00 AM Burchel, Abbi R wrote: CRM for notification. See Telephone encounter for: 05/22/18.  Pt requesting an upday re: disability paperwork sent over on 05/17/18.  Please call pt to advise: [phone]

## 2018-05-24 NOTE — Telephone Encounter (Signed)
Pt is calling to request an update.  Please advise.

## 2018-05-31 NOTE — Telephone Encounter (Signed)
Spoke to patient again this morning and advised her I looked over sharon's paperwork and neither me or her were able to locate her form at this time. I asked her if she can please have her employer faxed a new copy over today. I told her to please sent to our main fax # to attn Windell Mouldinguth and Jasmine DecemberSharon.

## 2018-05-31 NOTE — Telephone Encounter (Signed)
I am out of the office until Friday. Is there paperwork in my folder? If so, I can complete Friday.  Please notify pt.

## 2018-05-31 NOTE — Telephone Encounter (Signed)
Pt. calling again to get status of FMLA ppwk. [place] and [place] to advise.

## 2018-05-31 NOTE — Telephone Encounter (Signed)
I spoke to patient on Friday and she said she spoke to "some one in the office that told her her paperwork is being processed" I have not seen her form.

## 2018-06-01 NOTE — Telephone Encounter (Signed)
Called patient to make her aware Haley Carlson has not receive forms yet.

## 2018-06-02 NOTE — Telephone Encounter (Signed)
Received FMLA paperwork, MA made patient aware that provider is out of office until Mon, 06/12/18//SLS 12/20

## 2018-06-09 NOTE — Telephone Encounter (Signed)
Completed as much as possible; forwarded to provider/SLS 12/27

## 2018-06-15 NOTE — Telephone Encounter (Signed)
Patient is calling to see if she could get an update. I did advise her that the paperwork has been forwarded to [facility] and she returned to office on 12/30

## 2018-06-21 ENCOUNTER — Encounter: Payer: Self-pay | Admitting: Family

## 2018-06-21 ENCOUNTER — Ambulatory Visit (INDEPENDENT_AMBULATORY_CARE_PROVIDER_SITE_OTHER): Payer: BLUE CROSS/BLUE SHIELD | Admitting: Family

## 2018-06-21 ENCOUNTER — Telehealth: Payer: Self-pay

## 2018-06-21 VITALS — BP 115/79 | HR 62 | Temp 97.9°F | Resp 16 | Ht 65.0 in | Wt 154.0 lb

## 2018-06-21 DIAGNOSIS — M797 Fibromyalgia: Secondary | ICD-10-CM | POA: Diagnosis not present

## 2018-06-21 NOTE — Progress Notes (Signed)
Subjective:    Patient ID: Haley Carlson, female    DOB: May 08, 1966, 53 y.o.   MRN: [id number]  HPI  Patient is a 53 yr old female who presents today for follow up.   Reports that she has started journaling her pain.  Has come to the realization that "it's just fibromyalgia."  Trying not to work too hard. Started packing her things.  Plans to move to [place] with her Mom. She is looking forward to this change.    Review of Systems See HPI  Past Medical History:  Diagnosis Date  . Depression   . Implanon removal    PT HAD IMPLANON INSERTED 12/2009 AND REMOVED 03/17/10  . Osteopenia 10/15/2016   T score -2.4 10/15/16  . Substance abuse (HCC)    cocaine, marijuana, alcohol; sober since 10/2008  . SVT (supraventricular tachycardia) (HCC)      Social History   Socioeconomic History  . Marital status: Legally Separated    Spouse name: Not on file  . Number of children: 3  . Years of education: Not on file  . Highest education level: 12th grade  Occupational History    Employer: [employer]  Social Needs  . Financial resource strain: Not on file  . Food insecurity:    Worry: Not on file    Inability: Not on file  . Transportation needs:    Medical: Not on file    Non-medical: Not on file  Tobacco Use  . Smoking status: Current Every Day Smoker    Packs/day: 0.50    Years: 25.00    Pack years: 12.50    Types: Cigarettes  . Smokeless tobacco: Never Used  Substance and Sexual Activity  . 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 Drug use: Not Currently    Comment: in recovery  . Sexual activity: Not on file  Lifestyle  . Physical activity:    Days per week: Not on file    Minutes per session: Not on file  . Stress: Not on file  Relationships  . Social connections:    Talks on phone: Not on file    Gets together: Not on file    Attends religious service: Not on file    Active member of club or organization:  Not on file    Attends meetings of clubs or organizations: Not on file    Relationship status: Not on file  . Intimate partner violence:    Fear of current or ex partner: Not on file    Emotionally abused: Not on file    Physically abused: Not on file    Forced sexual activity: Not on file  Other Topics Concern  . Not on file  Social History Narrative   Works at [employer] as a Holiday representative   Married to her ex husband, has a boyfriend of 5 years   3 children   1998- son, at [employer] [place]   2001-son local    2004- daughter    One dog ("sancho")      Patient is right-handed. She drinks 1 cup of coffee a day. She does not exercise.    Past Surgical History:  Procedure Laterality Date  . AUGMENTATION MAMMAPLASTY  2009   IMPLANTS  . ENDOMETRIAL ABLATION  11/2007   HER OPTION ABLATION  . EXTERNAL EAR SURGERY  1993   CYST REMOVED    Family History  Problem Relation Age  of Onset  . Hypertension Mother   . Cancer Mother        breast cancer 2014  . Thyroid disease Mother        Hashimoto's  . Dementia Father     No Known Allergies  Current Outpatient Medications on File Prior to Visit  Medication Sig Dispense Refill  . Cholecalciferol (VITAMIN D3) 3000 units TABS Take 1 tablet by mouth daily. 30 tablet   . diclofenac sodium (VOLTAREN) 1 % GEL Apply 2 g topically 4 (four) times daily. 100 g 0  . DULoxetine (CYMBALTA) 30 MG capsule Take 1 capsule (30 mg total) by mouth daily. 30 capsule 3  . escitalopram (LEXAPRO) 20 MG tablet Take 0.5 tablets (10 mg total) by mouth daily. TAKE 1 TABLET BY MOUTH ONCE DAILY (Patient taking differently: Take 10 mg by mouth daily. ) 45 tablet 1  . ferrous sulfate 325 (65 FE) MG tablet Take 325 mg by mouth 2 (two) times daily with a meal.    . gabapentin (NEURONTIN) 100 MG capsule TAKE 1 CAPSULE BY MOUTH THREE TIMES DAILY 90 capsule 3  . ibuprofen (ADVIL,MOTRIN) 800 MG tablet Take 1 tablet (800 mg total) by mouth 3 (three) times daily. 21 tablet 0  .  magnesium gluconate (MAGONATE) 500 MG tablet Take 500 mg by mouth daily.    Marland Kitchen OVER THE COUNTER MEDICATION BC ARTHRITIS    . OVER THE COUNTER MEDICATION CBD capsules. Take 1 capsule daily as needed.     No current facility-administered medications on file prior to visit.     BP 115/79 (BP Location: Right Arm, Patient Position: Sitting, Cuff Size: Small)   Pulse 62   Temp 97.9 F (36.6 C) (Oral)   Resp 16   Ht 5\' 5"  (1.651 m)   Wt 154 lb (69.9 kg)   LMP 07/18/2011   SpO2 99%   BMI 25.63 kg/m       Objective:   Physical Exam Constitutional:      Appearance: She is well-developed.  Neck:     Musculoskeletal: Neck supple.     Thyroid: No thyromegaly.  Cardiovascular:     Rate and Rhythm: Normal rate and regular rhythm.     Heart sounds: Normal heart sounds. No murmur.  Pulmonary:     Effort: Pulmonary effort is normal. No respiratory distress.     Breath sounds: Normal breath sounds. No wheezing.  Skin:    General: Skin is warm and dry.  Neurological:     Mental Status: She is alert and oriented to person, place, and time.  Psychiatric:        Behavior: Behavior normal.        Thought Content: Thought content normal.        Judgment: Judgment normal.           Assessment & Plan:  Fibromyalgia/chronic pain- unchanged.  Plan to continue current meds. I advised her that I do not think she is stable to return to her job at [employer] at this time.  I also provided a note for her employer.   A total of 15  minutes were spent face-to-face with the patient during this encounter and over half of that time was spent on counseling and coordination of care. The patient was counseled on fibromyalgia/chronic pain/work restrictions.

## 2018-06-21 NOTE — Telephone Encounter (Signed)
Called and left voice mail for them to call me back about requested informationn. RS CMA  Copied from CRM [id number]. Topic: General - Other >> Jun 20, 2018  8:07 AM Gaynelle Adu wrote: Reason for CRM:  [employer]- disability is calling to request the patient treatment plan, and a ideal date of the patient return to work.  Best contact number is [phone]. Please advise

## 2018-06-22 NOTE — Telephone Encounter (Signed)
Received new [employer] Forms at [facility] with questions to be answered by provider regarding what & why patient has been extended further LOA; forwarded to provider/SLS 01/09

## 2018-06-22 NOTE — Telephone Encounter (Signed)
Talked to [facility]-disability department and advised patient was seen yesterday and was not cleared to go back to work. Patient will follow up with [place] in one month.

## 2018-07-28 ENCOUNTER — Telehealth: Payer: Self-pay | Admitting: Family

## 2018-07-28 NOTE — Telephone Encounter (Signed)
See crm 

## 2018-07-28 NOTE — Telephone Encounter (Signed)
Copied from CRM [id number]. Topic: General - Other >> Jul 28, 2018 12:18 PM Jilda Roche wrote: Reason for CRM: Dr Virgina Organ is calling to speak to Dr. Peggyann Juba   Call back  at [phone]  Called her back. She is reviewing her Long term disability from [employer] and had questions re: last PHQ-9.  Score provided.

## 2018-08-02 ENCOUNTER — Telehealth: Payer: Self-pay | Admitting: *Deleted

## 2018-08-02 NOTE — Telephone Encounter (Signed)
Received Physician Letter summarizing his understanding of recent phone conversation with PCP, pt's Disability Case Manager is requesting signature with selection of changes and/or no changes of the summarization in the letter regarding pt's diagnoses, restrictions, & limitations from [facility] Disability Claims; forwarded to provider/SLS 02/19

## 2019-11-02 IMAGING — DX DG CERVICAL SPINE COMPLETE 4+V
6 series · 6 of 6 positions shown · non-contrast
Comparison: None.

CLINICAL DATA: Chronic neck pain which is worsening

EXAM:
CERVICAL SPINE - COMPLETE 4+ VIEW

[c-spine lat]
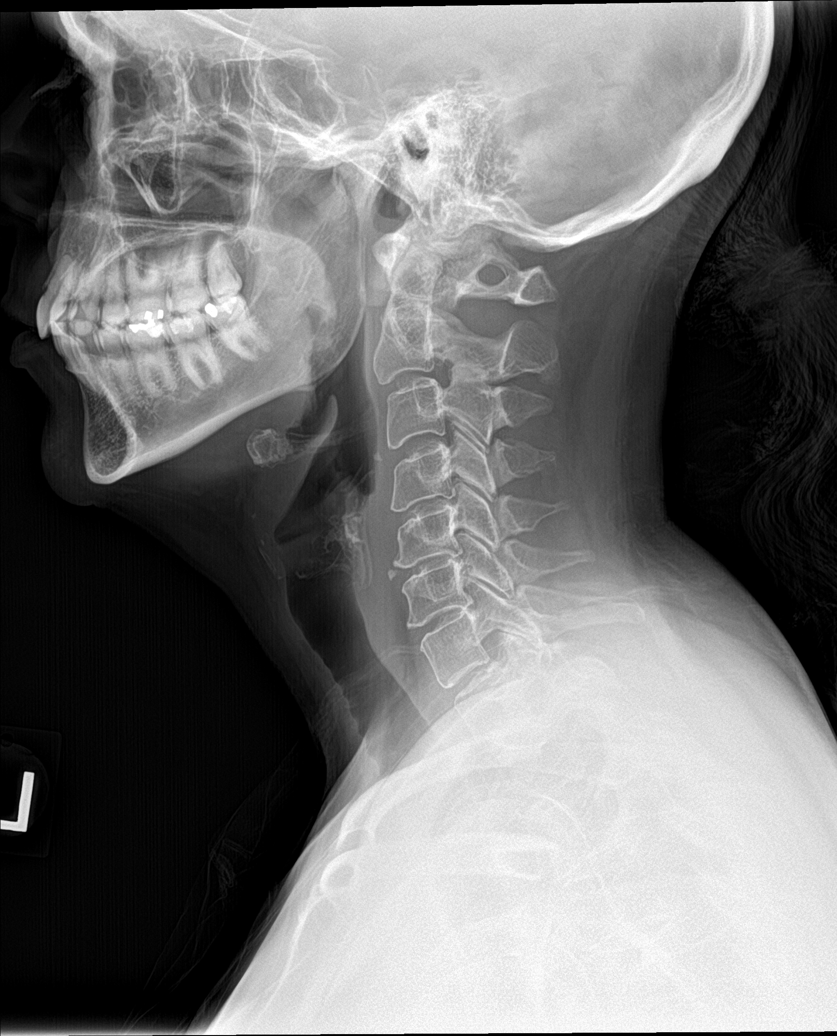

[c-spine obl (1 of 2)]
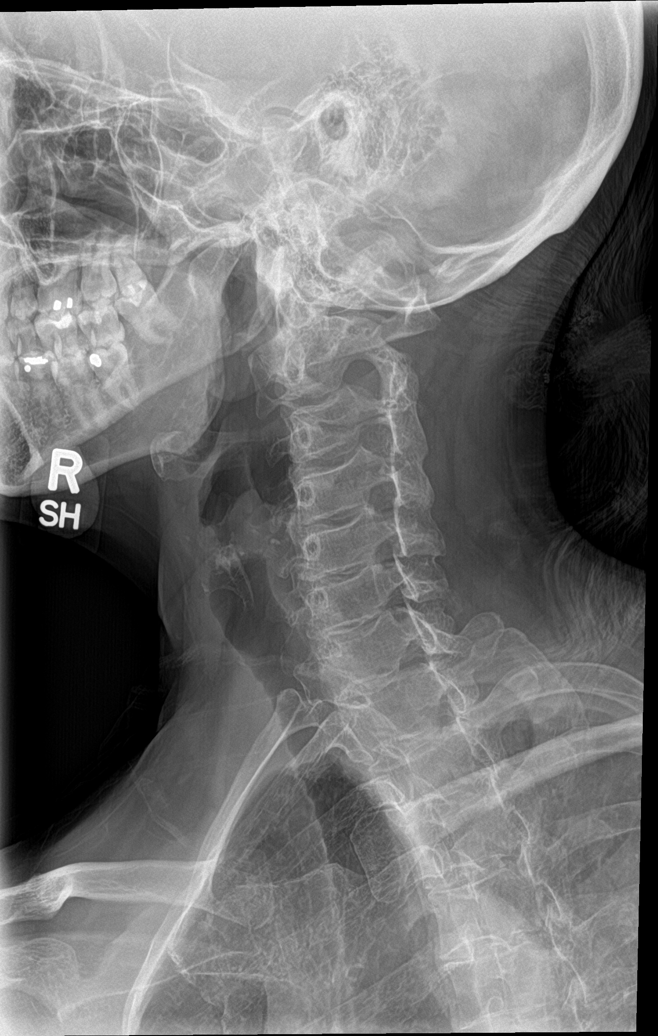

[c-spine obl (2 of 2)]
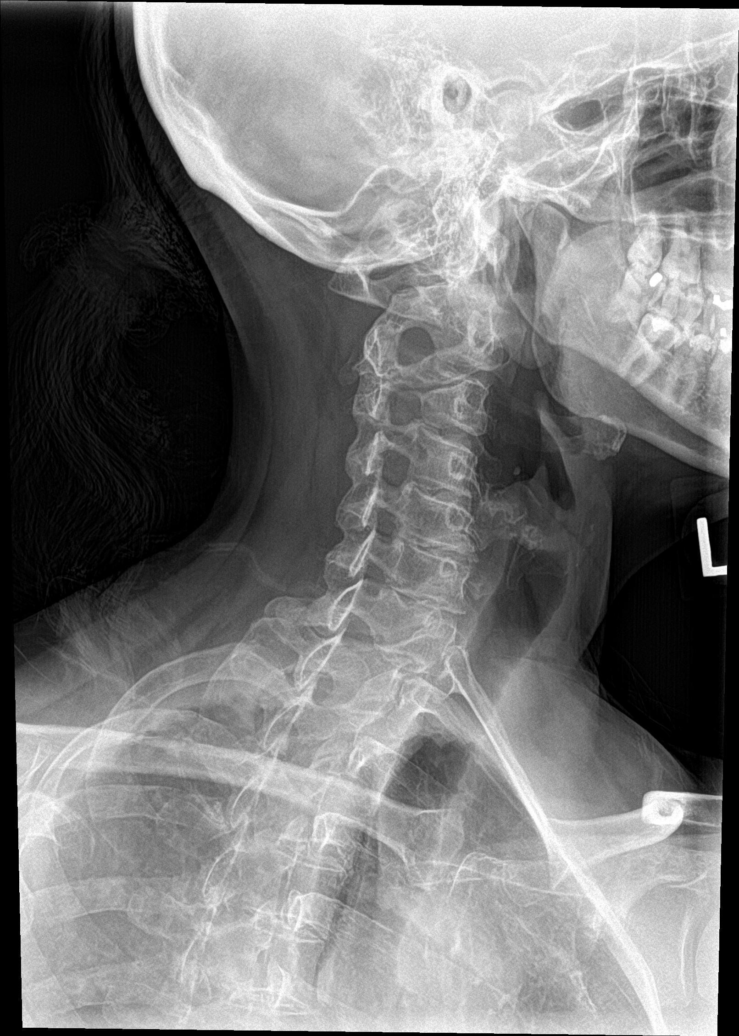

[c-spine ap]
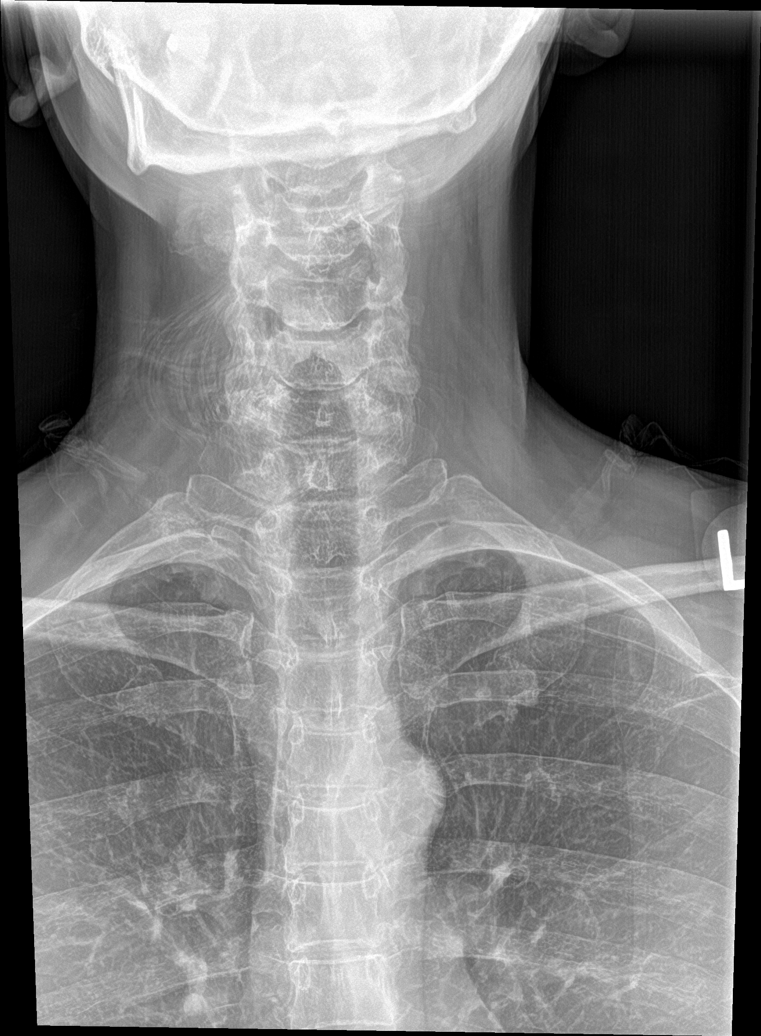

[c-spine open mouth]
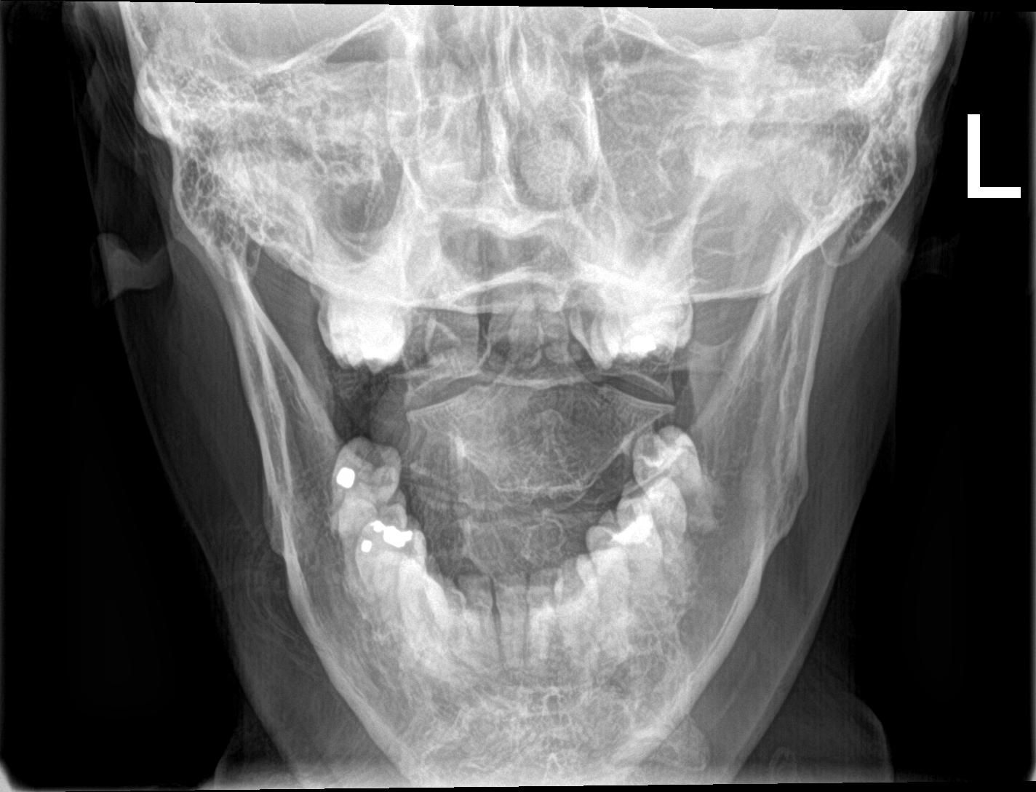

[[person_name]]
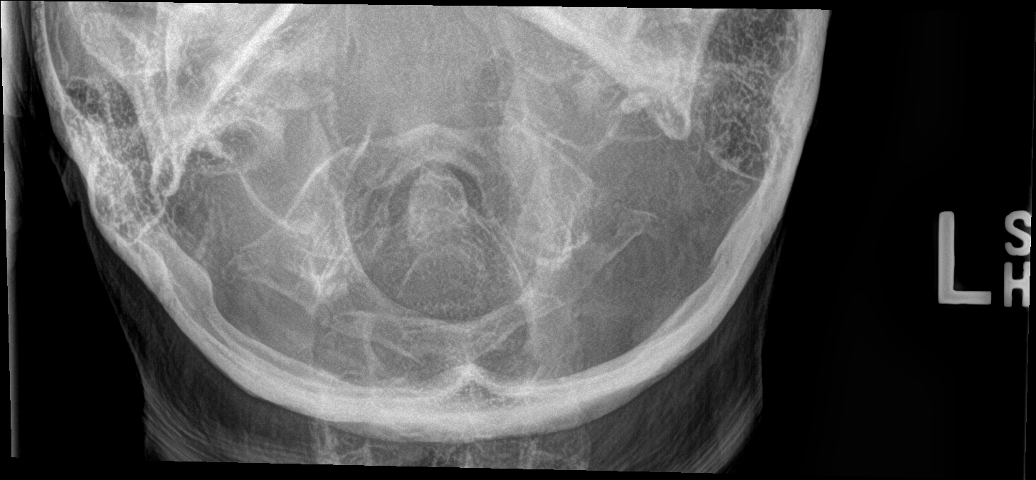

[6 of 6 positions shown; findings below may reference images not displayed]

FINDINGS: The cervical vertebral bodies are preserved in height. There is mild
disc space narrowing at C5-6 and at C6-7. There is no perched facet.
The spinous processes are intact. The oblique views reveal no
high-grade bony encroachment upon the neural foramina. The odontoid
is intact. The prevertebral soft tissue spaces are normal.
IMPRESSION: Mild degenerative disc space narrowing at C5-6 and C6-7. No acute
bony abnormality.
